# Patient Record
Sex: Male | Born: 1960 | Race: Black or African American | Hispanic: No | Marital: Married | Smoking: Never smoker
Health system: Southern US, Community
[De-identification: ages and names within clinical notes are randomized; demographics above are authoritative.]

## PROBLEM LIST (undated history)

## (undated) ENCOUNTER — Emergency Department (HOSPITAL_BASED_OUTPATIENT_CLINIC_OR_DEPARTMENT_OTHER): Source: Home / Self Care

## (undated) DIAGNOSIS — I10 Essential (primary) hypertension: Secondary | ICD-10-CM

## (undated) DIAGNOSIS — E7800 Pure hypercholesterolemia, unspecified: Secondary | ICD-10-CM

## (undated) DIAGNOSIS — R51 Headache: Secondary | ICD-10-CM

## (undated) DIAGNOSIS — I209 Angina pectoris, unspecified: Secondary | ICD-10-CM

## (undated) DIAGNOSIS — E785 Hyperlipidemia, unspecified: Secondary | ICD-10-CM

## (undated) HISTORY — PX: BACK SURGERY: SHX140

## (undated) NOTE — ED Notes (Signed)
 Formatting of this note might be different from the original. Pt is currently on his cell phone notifying his family that he is in the emergency room.  Currently stable  Electronically signed by Lafe Mt at 05/06/2021 12:41 PM EDT

## (undated) NOTE — ED Notes (Signed)
 Formatting of this note might be different from the original. Rounded on pt  Pt is unable to urinate at this time.  Electronically signed by Lafe Mt at 05/06/2021  1:14 PM EDT

## (undated) NOTE — ED Triage Notes (Signed)
 Formatting of this note might be different from the original. Pt arrives via ems with c/o syncopal episode  Pt was at work when this occurred  He became dizzy  Did not fall or hit head  Well-appearing  Was hypotensive with ems   Electronically signed by Lafe Mt at 05/06/2021 12:04 PM EDT

## (undated) NOTE — ED Notes (Signed)
 Formatting of this note might be different from the original. Rounded on pt  Pt resting in position of comfort  Family at bedside   Repeat labs collected.   Electronically signed by Lafe Mt at 05/06/2021  2:50 PM EDT

## (undated) NOTE — ED Provider Notes (Signed)
 Formatting of this note is different from the original. EMERGENCY DEPARTMENT HISTORY AND PHYSICAL EXAM  12:20 PM  Date: 05/06/2021 Patient Name: Jeremy Reed  History of Presenting Illness   Chief Complaint  Patient presents with  ? Dizziness   History Provided By: Patient Location/Duration/Severity/Modifying factors  HPI Jeremy Reed is a 85 y.o. male with asthma history of hypertension diabetes presenting for a near syncopal episode.  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  He was able to close up the trailer, walk around outside to do the same but then sat down because he felt like he was going to fall out.  The triage note says that he is complaining feeling dizzy but on clarification this is more lightheaded and presyncopal.  He is not having any chest pain, shortness of breath, abdominal pain, nausea, vomiting or diaphoresis during this.  He never did lose consciousness, fall or hit his head.  He was hypotensive on EMS arrival, received 200 cc bolus in route and now is normotensive.  He says he is feeling much better now that he is in the cool emergency room.  He denies tobacco, alcohol or illicit drug use.  No alleviating or aggravating factors.  Additionally complains of a dry nonproductive cough for the last few days, has been vaccinated against COVID and received a booster shot.  Would like to be tested for COVID.  PCP: Johnnye, Not On File, MD  Current Facility-Administered Medications  Medication Dose Route Frequency Provider Last Rate Last Admin  ? potassium chloride  (K-DUR, KLOR-CON  M20) SR tablet 40 mEq  40 mEq Oral NOW Darleen Redell BROCKS, MD       Current Outpatient Medications  Medication Sig Dispense Refill  ? omeprazole  (PRILOSEC) 20 mg capsule Take 20 mg by mouth daily.    ? metFORMIN  (GLUCOPHAGE ) 500 mg tablet Take  by mouth two (2) times daily (with meals).    ?  Olmesartan -amLODIPine -HCTZ 40-10-12.5 mg tab Take 1 Tab by mouth daily.    ? cholecalciferol , vitamin D3, 2,000 unit tab Take 2.5 Tabs by mouth daily.    ? atorvastatin (LIPITOR) 20 mg tablet Take 20 mg by mouth daily.    ? nebivolol  (BYSTOLIC ) 20 mg tablet Take 20 mg by mouth daily.    ? aspirin  delayed-release 81 mg tablet Take 81 mg by mouth daily.    ? multivitamin (ONE A DAY) tablet Take 1 Tab by mouth daily.    ? ondansetron  hcl (ZOFRAN , AS HYDROCHLORIDE,) 4 mg tablet Take 1 Tab by mouth every eight (8) hours as needed for Nausea. 10 Tab 0   Past History   Past Medical History: Past Medical History:  Diagnosis Date  ? Diabetes (HCC)   ? Gastrointestinal disorder    GERD  ? Hypertension    Past Surgical History: Past Surgical History:  Procedure Laterality Date  ? HX ORTHOPAEDIC     , back  ? HX OTHER SURGICAL     tonsillectomy   Family History: Family History  Problem Relation Age of Onset  ? Heart Disease Mother   ? Stroke Father    Social History: Social History   Tobacco Use  ? Smoking status: Never Smoker  ? Smokeless tobacco: Never Used  Substance Use Topics  ? Alcohol use: Yes    Comment: socially  ? Drug use: Not on file   Allergies:  No Known Allergies  I reviewed and confirmed the above information with patient and updated as necessary.  Review of Systems   Review of Systems  Constitutional: Negative for diaphoresis and fever.  HENT: Negative for ear pain and sore throat.   Eyes:       No acute change in vision  Respiratory: Negative for cough and shortness of breath.   Cardiovascular: Negative for chest pain and leg swelling.  Gastrointestinal: Negative for abdominal pain and vomiting.  Genitourinary: Negative for dysuria.  Musculoskeletal: Negative for neck pain.  Skin: Negative for wound.  Neurological: Positive for syncope (Near syncope). Negative for weakness and headaches.   Physical Exam   Visit Vitals BP (!) 149/78  Pulse 79   Temp 98.6 F (37 C)  Resp 20  Ht 5' 9 (1.753 m)  Wt 122.5 kg (270 lb)  SpO2 98%  BMI 39.87 kg/m   Physical Exam Vitals and nursing note reviewed.  Constitutional:      Appearance: Normal appearance. He is not ill-appearing.     Comments: Adult male resting comfortably, awake and alert, on the phone  HENT:     Mouth/Throat:     Mouth: Mucous membranes are moist.  Eyes:     Pupils: Pupils are equal, round, and reactive to light.  Cardiovascular:     Rate and Rhythm: Normal rate and regular rhythm.     Pulses: Normal pulses.     Heart sounds: Normal heart sounds.  Pulmonary:     Effort: Pulmonary effort is normal.     Breath sounds: Normal breath sounds.  Abdominal:     General: Abdomen is flat.     Tenderness: There is no abdominal tenderness.  Musculoskeletal:        General: No swelling. Normal range of motion.     Cervical back: Normal range of motion.  Skin:    General: Skin is warm.  Neurological:     General: No focal deficit present.     Mental Status: He is alert and oriented to person, place, and time.     Cranial Nerves: No cranial nerve deficit.     Sensory: No sensory deficit.     Motor: No weakness.   Diagnostic Study Results   Labs - Recent Results (from the past 24 hour(s))  CBC WITH AUTOMATED DIFF   Collection Time: 05/06/21 12:00 PM  Result Value Ref Range   WBC 7.5 4.6 - 13.2 K/uL   RBC 4.34 (L) 4.35 - 5.65 M/uL   HGB 12.6 (L) 13.0 - 16.0 g/dL   HCT 62.5 63.9 - 51.9 %   MCV 86.2 78.0 - 100.0 FL   MCH 29.0 24.0 - 34.0 PG   MCHC 33.7 31.0 - 37.0 g/dL   RDW 85.0 (H) 88.3 - 85.4 %   PLATELET 143 135 - 420 K/uL   MPV 11.6 9.2 - 11.8 FL   NRBC 0.0 0 PER 100 WBC   ABSOLUTE NRBC 0.00 0.00 - 0.01 K/uL   NEUTROPHILS 59 40 - 73 %   LYMPHOCYTES 26 21 - 52 %   MONOCYTES 13 (H) 3 - 10 %   EOSINOPHILS 1 0 - 5 %   BASOPHILS 1 0 - 2 %   IMMATURE GRANULOCYTES 0 0.0 - 0.5 %   ABS. NEUTROPHILS 4.4 1.8 - 8.0 K/UL   ABS. LYMPHOCYTES 1.9 0.9 - 3.6 K/UL    ABS. MONOCYTES 1.0 0.05 - 1.2 K/UL   ABS. EOSINOPHILS 0.1 0.0 - 0.4 K/UL  ABS. BASOPHILS 0.0 0.0 - 0.1 K/UL   ABS. IMM. GRANS. 0.0 0.00 - 0.04 K/UL   DF AUTOMATED    METABOLIC PANEL, COMPREHENSIVE   Collection Time: 05/06/21 12:00 PM  Result Value Ref Range   Sodium 137 136 - 145 mmol/L   Potassium 3.0 (L) 3.5 - 5.5 mmol/L   Chloride 102 100 - 111 mmol/L   CO2 27 21 - 32 mmol/L   Anion gap 8 3.0 - 18 mmol/L   Glucose 155 (H) 74 - 99 mg/dL   BUN 10 7.0 - 18 MG/DL   Creatinine 8.76 0.6 - 1.3 MG/DL   BUN/Creatinine ratio 8 (L) 12 - 20     GFR est AA >60 >60 ml/min/1.81m2   GFR est non-AA 60 (L) >60 ml/min/1.78m2   Calcium  8.6 8.5 - 10.1 MG/DL   Bilirubin, total 0.5 0.2 - 1.0 MG/DL   ALT (SGPT) 59 16 - 61 U/L   AST (SGOT) 38 10 - 38 U/L   Alk. phosphatase 69 45 - 117 U/L   Protein, total 7.5 6.4 - 8.2 g/dL   Albumin 3.4 3.4 - 5.0 g/dL   Globulin 4.1 (H) 2.0 - 4.0 g/dL   A-G Ratio 0.8 0.8 - 1.7    TROPONIN-HIGH SENSITIVITY   Collection Time: 05/06/21 12:00 PM  Result Value Ref Range   Troponin-High Sensitivity 75 0 - 78 ng/L  NT-PRO BNP   Collection Time: 05/06/21 12:00 PM  Result Value Ref Range   NT pro-BNP 161 0 - 900 PG/ML  EKG, 12 LEAD, INITIAL   Collection Time: 05/06/21 12:07 PM  Result Value Ref Range   Ventricular Rate 76 BPM   Atrial Rate 76 BPM   P-R Interval 172 ms   QRS Duration 146 ms   Q-T Interval 394 ms   QTC Calculation (Bezet) 443 ms   Calculated P Axis 45 degrees   Calculated R Axis -27 degrees   Calculated T Axis -45 degrees   Diagnosis      Normal sinus rhythm Right bundle branch block Minimal voltage criteria for LVH, may be normal variant T wave abnormality, consider inferolateral ischemia Abnormal ECG No previous ECGs available Confirmed by Porfirio, MD, Ryan [id number]) on 05/06/2021 12:41:35 PM  SARS-COV-2   Collection Time: 05/06/21 12:30 PM  Result Value Ref Range   SARS-CoV-2 by PCR Please find results under separate order    EKG, 12  LEAD, SUBSEQUENT   Collection Time: 05/06/21  2:29 PM  Result Value Ref Range   Ventricular Rate 82 BPM   Atrial Rate 82 BPM   P-R Interval 182 ms   QRS Duration 146 ms   Q-T Interval 386 ms   QTC Calculation (Bezet) 450 ms   Calculated P Axis 41 degrees   Calculated R Axis -25 degrees   Calculated T Axis -40 degrees   Diagnosis      Normal sinus rhythm Right bundle branch block T wave abnormality, consider lateral ischemia Abnormal ECG When compared with ECG of 06-May-2021 12:07, No significant change was found Confirmed by Porfirio, MD, Ryan ([id number]) on 05/06/2021 4:07:54 PM  URINALYSIS W/ RFLX MICROSCOPIC   Collection Time: 05/06/21  2:30 PM  Result Value Ref Range   Color YELLOW     Appearance CLEAR     Specific gravity 1.014 1.005 - 1.030     pH (UA) 6.0 5.0 - 8.0     Protein Negative NEG mg/dL   Glucose Negative NEG mg/dL   Ketone Negative NEG mg/dL  Bilirubin Negative NEG     Blood Negative NEG     Urobilinogen 1.0 0.2 - 1.0 EU/dL   Nitrites Negative NEG     Leukocyte Esterase Negative NEG    TROPONIN-HIGH SENSITIVITY   Collection Time: 05/06/21  2:30 PM  Result Value Ref Range   Troponin-High Sensitivity 75 0 - 78 ng/L   Radiologic Studies -  XR CHEST PORT  Final Result  No acute cardiopulmonary findings.    Medical Decision Making  I am the first provider for this patient.  I reviewed the vital signs, available nursing notes, past medical history, past surgical history, family history and social history.  Vital Signs-Reviewed the patient's vital signs.  EKG: Right bundle branch block, no ST elevation, no priors for comparison  Records Reviewed: Nursing Notes and Old Medical Records (Time of Review: 12:20 PM)  Provider Notes (Medical Decision Making):  MDM 28 year old male here for any syncopal episode consider dehydration, AKI, arrhythmia, no evidence of any seizure or trauma  ED Course: Progress Notes, Reevaluation, and Consults: Patient afebrile  and hemodynamically normal on arrival, awake and alert, oriented, exam is reassuring  We will screen labs, troponin, chest x-ray.  At his request will test for COVID.  CBC unremarkable CMP with hypokalemia, will replete orally Initial troponin 75, EKG shows no ST elevation but there is no prior for comparison, he does have a right bundle branch block with ST depression diffusely, but not focally Repeat EKG and troponin are level at 75 I discussed with Renee from cardiology who will talk to her attending, and independently reviewed the EKG, and recommend outpatient follow-up, I think this is reasonable as the patient is asymptomatic and has had no chest pain  Patient was discharged home in stable condition, will follow up with cardiology outpatient.  Signs and symptoms prompting return to the ED were discussed.  He was discharged home in stable condition.    Procedures  Diagnosis   Clinical Impression:  1. Near syncope   2. Right bundle branch block (RBBB)   3. EKG abnormality   4. Hypokalemia    Disposition:Home  Follow-up Information    Follow up With Specialties Details Why Contact Info   [facility] EMERGENCY DEPT Emergency Medicine  As needed, If symptoms worsen [address] [place]  [address] [phone]   Seutter, Bernardino SAILOR, MD Cardiovascular Disease Physician, Internal Medicine Physician Schedule an appointment as soon as possible for a visit   [address] [place] [address] [phone]     Patient's Medications  Start Taking   No medications on file  Continue Taking   ASPIRIN  DELAYED-RELEASE 81 MG TABLET    Take 81 mg by mouth daily.   ATORVASTATIN (LIPITOR) 20 MG TABLET    Take 20 mg by mouth daily.   CHOLECALCIFEROL , VITAMIN D3, 2,000 UNIT TAB    Take 2.5 Tabs by mouth daily.   METFORMIN  (GLUCOPHAGE ) 500 MG TABLET    Take  by mouth two (2) times daily (with meals).   MULTIVITAMIN (ONE A DAY) TABLET    Take 1 Tab by mouth daily.   NEBIVOLOL   (BYSTOLIC ) 20 MG TABLET    Take 20 mg by mouth daily.   OLMESARTAN -AMLODIPINE -HCTZ 40-10-12.5 MG TAB    Take 1 Tab by mouth daily.   OMEPRAZOLE  (PRILOSEC) 20 MG CAPSULE    Take 20 mg by mouth daily.   ONDANSETRON  HCL (ZOFRAN , AS HYDROCHLORIDE,) 4 MG TABLET    Take 1 Tab by mouth  every eight (8) hours as needed for Nausea.  These Medications have changed   No medications on file  Stop Taking   No medications on file   Redell Breeding, MD  Emergency Medicine  May 06, 2021, 12:20 PM   This note is dictated utilizing Conservation officer, historic buildings. Unfortunately this leads to occasional typographical errors using the voice recognition. I apologize in advance if the situation occurs. If questions occur please do not hesitate to contact me directly.  Redell JAYSON Breeding, MD  Electronically signed by Breeding Redell JAYSON, MD at 05/06/2021  4:30 PM EDT

---

## 1978-11-28 HISTORY — PX: ANKLE FRACTURE SURGERY: SHX122

## 2001-11-28 HISTORY — PX: OTHER SURGICAL HISTORY: SHX169

## 2006-07-27 ENCOUNTER — Ambulatory Visit (HOSPITAL_BASED_OUTPATIENT_CLINIC_OR_DEPARTMENT_OTHER): Admission: RE | Admit: 2006-07-27 | Discharge: 2006-07-27 | Payer: Self-pay | Admitting: Orthopedic Surgery

## 2006-08-08 ENCOUNTER — Ambulatory Visit (HOSPITAL_BASED_OUTPATIENT_CLINIC_OR_DEPARTMENT_OTHER): Admission: RE | Admit: 2006-08-08 | Discharge: 2006-08-08 | Payer: Self-pay | Admitting: Orthopedic Surgery

## 2007-01-01 ENCOUNTER — Emergency Department (HOSPITAL_COMMUNITY): Admission: EM | Admit: 2007-01-01 | Discharge: 2007-01-02 | Payer: Self-pay | Admitting: Emergency Medicine

## 2011-11-05 ENCOUNTER — Emergency Department (HOSPITAL_COMMUNITY)

## 2011-11-05 ENCOUNTER — Observation Stay (HOSPITAL_COMMUNITY)
Admission: EM | Admit: 2011-11-05 | Discharge: 2011-11-06 | Disposition: A | Discharge status: Home / Self Care | Attending: Internal Medicine | Admitting: Internal Medicine

## 2011-11-05 ENCOUNTER — Other Ambulatory Visit: Payer: Self-pay

## 2011-11-05 ENCOUNTER — Encounter: Payer: Self-pay | Admitting: Emergency Medicine

## 2011-11-05 DIAGNOSIS — E785 Hyperlipidemia, unspecified: Secondary | ICD-10-CM | POA: Diagnosis present

## 2011-11-05 DIAGNOSIS — R079 Chest pain, unspecified: Principal | ICD-10-CM

## 2011-11-05 DIAGNOSIS — E119 Type 2 diabetes mellitus without complications: Secondary | ICD-10-CM | POA: Diagnosis present

## 2011-11-05 DIAGNOSIS — E876 Hypokalemia: Secondary | ICD-10-CM | POA: Insufficient documentation

## 2011-11-05 DIAGNOSIS — I10 Essential (primary) hypertension: Secondary | ICD-10-CM | POA: Diagnosis present

## 2011-11-05 HISTORY — DX: Headache: R51

## 2011-11-05 HISTORY — DX: Essential (primary) hypertension: I10

## 2011-11-05 HISTORY — DX: Hyperlipidemia, unspecified: E78.5

## 2011-11-05 HISTORY — DX: Pure hypercholesterolemia, unspecified: E78.00

## 2011-11-05 HISTORY — DX: Angina pectoris, unspecified: I20.9

## 2011-11-05 LAB — DIFFERENTIAL
Eosinophils Absolute: 0.1 10*3/uL (ref 0.0–0.7)
Lymphocytes Relative: 29 % (ref 12–46)
Lymphs Abs: 2.5 10*3/uL (ref 0.7–4.0)
Monocytes Relative: 11 % (ref 3–12)
Neutrophils Relative %: 58 % (ref 43–77)

## 2011-11-05 LAB — POCT I-STAT, CHEM 8
BUN: 16 mg/dL (ref 6–23)
Creatinine, Ser: 1 mg/dL (ref 0.50–1.35)
Glucose, Bld: 111 mg/dL — ABNORMAL HIGH (ref 70–99)
Hemoglobin: 14.3 g/dL (ref 13.0–17.0)
Potassium: 3.2 mEq/L — ABNORMAL LOW (ref 3.5–5.1)

## 2011-11-05 LAB — GLUCOSE, CAPILLARY
Glucose-Capillary: 94 mg/dL (ref 70–99)
Glucose-Capillary: 112 mg/dL — ABNORMAL HIGH (ref 70–99)

## 2011-11-05 LAB — CBC
Hemoglobin: 14.2 g/dL (ref 13.0–17.0)
MCH: 30.9 pg (ref 26.0–34.0)
MCV: 88 fL (ref 78.0–100.0)
RBC: 4.59 MIL/uL (ref 4.22–5.81)
WBC: 8.7 10*3/uL (ref 4.0–10.5)

## 2011-11-05 LAB — CARDIAC PANEL(CRET KIN+CKTOT+MB+TROPI)
CK, MB: 2.3 ng/mL (ref 0.3–4.0)
Troponin I: <0.3 ng/mL (ref <0.30)

## 2011-11-05 LAB — D-DIMER, QUANTITATIVE
D-Dimer, Quant: <0.22 ug/mL-FEU (ref 0.00–0.48)
D-Dimer, Quant: 0.25 ug/mL-FEU (ref 0.00–0.48)

## 2011-11-05 LAB — MRSA PCR SCREENING: MRSA by PCR: NEGATIVE

## 2011-11-05 LAB — POCT I-STAT TROPONIN I

## 2011-11-05 MED ORDER — ALUM & MAG HYDROXIDE-SIMETH 200-200-20 MG/5ML PO SUSP
30.0000 mL | Freq: Four times a day (QID) | ORAL | Status: DC | PRN
  Administered 2011-11-06: 30 mL via ORAL
  Filled 2011-11-05: qty 30

## 2011-11-05 MED ORDER — MORPHINE SULFATE 2 MG/ML IJ SOLN
2.0000 mg | INTRAMUSCULAR | Status: DC | PRN
  Administered 2011-11-06: 2 mg via INTRAVENOUS
  Filled 2011-11-05: qty 1

## 2011-11-05 MED ORDER — METFORMIN HCL 500 MG PO TABS
1000.0000 mg | ORAL_TABLET | Freq: Two times a day (BID) | ORAL | Status: DC
  Administered 2011-11-05 – 2011-11-06 (×2): 1000 mg via ORAL
  Filled 2011-11-05 (×4): qty 2

## 2011-11-05 MED ORDER — POTASSIUM CHLORIDE CRYS ER 20 MEQ PO TBCR
20.0000 meq | EXTENDED_RELEASE_TABLET | Freq: Every day | ORAL | Status: DC
  Administered 2011-11-05 – 2011-11-06 (×2): 20 meq via ORAL
  Filled 2011-11-05: qty 1

## 2011-11-05 MED ORDER — NITROGLYCERIN 0.4 MG SL SUBL
0.4000 mg | SUBLINGUAL_TABLET | SUBLINGUAL | Status: DC | PRN
  Administered 2011-11-06 (×2): 0.4 mg via SUBLINGUAL

## 2011-11-05 MED ORDER — EZETIMIBE 10 MG PO TABS
10.0000 mg | ORAL_TABLET | Freq: Every day | ORAL | Status: DC
  Administered 2011-11-05 – 2011-11-06 (×2): 10 mg via ORAL
  Filled 2011-11-05 (×2): qty 1

## 2011-11-05 MED ORDER — ENOXAPARIN SODIUM 40 MG/0.4ML ~~LOC~~ SOLN
40.0000 mg | SUBCUTANEOUS | Status: DC
  Administered 2011-11-05: 40 mg via SUBCUTANEOUS
  Filled 2011-11-05 (×2): qty 0.4

## 2011-11-05 MED ORDER — ROSUVASTATIN CALCIUM 10 MG PO TABS
10.0000 mg | ORAL_TABLET | Freq: Every day | ORAL | Status: DC
  Administered 2011-11-05 – 2011-11-06 (×2): 10 mg via ORAL
  Filled 2011-11-05 (×2): qty 1

## 2011-11-05 MED ORDER — VITAMIN D 1000 UNITS PO TABS
1000.0000 [IU] | ORAL_TABLET | Freq: Every day | ORAL | Status: DC
  Administered 2011-11-05 – 2011-11-06 (×2): 1000 [IU] via ORAL
  Filled 2011-11-05 (×2): qty 1

## 2011-11-05 MED ORDER — ASPIRIN 81 MG PO CHEW: 324.0000 mg | CHEWABLE_TABLET | Freq: Once | ORAL | Status: DC

## 2011-11-05 MED ORDER — THERA M PLUS PO TABS
1.0000 | ORAL_TABLET | Freq: Every day | ORAL | Status: DC
  Administered 2011-11-05 – 2011-11-06 (×2): 1 via ORAL
  Filled 2011-11-05 (×2): qty 1

## 2011-11-05 MED ORDER — OLMESARTAN-AMLODIPINE-HCTZ 40-10-25 MG PO TABS: 1.0000 | ORAL_TABLET | Freq: Every day | ORAL | Status: DC

## 2011-11-05 MED ORDER — OLMESARTAN MEDOXOMIL 40 MG PO TABS
40.0000 mg | ORAL_TABLET | Freq: Every day | ORAL | Status: DC
  Administered 2011-11-05 – 2011-11-06 (×2): 40 mg via ORAL
  Filled 2011-11-05 (×2): qty 1

## 2011-11-05 MED ORDER — SODIUM CHLORIDE 0.9 % IJ SOLN: 3.0000 mL | INTRAMUSCULAR | Status: DC | PRN

## 2011-11-05 MED ORDER — SODIUM CHLORIDE 0.9 % IV SOLN: 250.0000 mL | INTRAVENOUS | Status: DC | PRN

## 2011-11-05 MED ORDER — NEBIVOLOL HCL 10 MG PO TABS
10.0000 mg | ORAL_TABLET | Freq: Every day | ORAL | Status: DC
  Administered 2011-11-05 – 2011-11-06 (×2): 10 mg via ORAL
  Filled 2011-11-05 (×2): qty 1

## 2011-11-05 MED ORDER — SODIUM CHLORIDE 0.9 % IJ SOLN
3.0000 mL | Freq: Two times a day (BID) | INTRAMUSCULAR | Status: DC
  Administered 2011-11-05 – 2011-11-06 (×3): 3 mL via INTRAVENOUS

## 2011-11-05 MED ORDER — AMLODIPINE BESYLATE 10 MG PO TABS
10.0000 mg | ORAL_TABLET | Freq: Every day | ORAL | Status: DC
  Administered 2011-11-05 – 2011-11-06 (×2): 10 mg via ORAL
  Filled 2011-11-05 (×2): qty 1

## 2011-11-05 MED ORDER — HYDROCHLOROTHIAZIDE 25 MG PO TABS
25.0000 mg | ORAL_TABLET | Freq: Every day | ORAL | Status: DC
  Administered 2011-11-06: 25 mg via ORAL
  Filled 2011-11-05 (×2): qty 1

## 2011-11-05 MED ORDER — SODIUM CHLORIDE 0.9 % IV SOLN
Freq: Once | INTRAVENOUS | Status: AC
  Administered 2011-11-05: 05:00:00 via INTRAVENOUS

## 2011-11-05 MED ORDER — ASPIRIN 81 MG PO CHEW
81.0000 mg | CHEWABLE_TABLET | Freq: Every day | ORAL | Status: DC
  Administered 2011-11-06: 81 mg via ORAL
  Filled 2011-11-05: qty 1

## 2011-11-05 NOTE — ED Notes (Signed)
Lab at bedside

## 2011-11-05 NOTE — ED Provider Notes (Signed)
History     CSN: [id number] Arrival date & time: 11/05/2011  3:51 AM   First MD Initiated Contact with Patient 11/05/11 [phone]      Chief Complaint  Patient presents with  . Chest Pain    (Consider location/radiation/quality/duration/timing/severity/associated sxs/prior treatment) HPI This is a 50 year old black male with a history of hypertension and hyperlipidemia as well as diabetes mellitus. He's had several episodes of chest pain since yesterday morning. The first episode occurred after eating. He states he has pain with swallowing, describing it as if there is a warm or speed bump that the food must pass. This morning he awoke with 7/10 chest pain. There was no associated dyspnea, nausea or diaphoresis. EMS was called and they administered one sublingual nitroglycerin with significant relief of pain. He states he only has a little bit of pain now when he takes a deep breath. He has a history of coronary artery disease.  Past Medical History  Diagnosis Date  . Hypertension   . Hyperlipidemia   . Diabetes mellitus   . Hypercholesteremia     History reviewed. No pertinent past surgical history.  History reviewed. No pertinent family history.  History  Substance Use Topics  . Smoking status: Never Smoker   . Smokeless tobacco: Not on file  . Alcohol Use: No      Review of Systems  All other systems reviewed and are negative.    Allergies  Review of patient's allergies indicates no known allergies.  Home Medications   Current Outpatient Rx  Name Route Sig Dispense Refill  . ASPIRIN 81 MG PO TABS Oral Take 81 mg by mouth daily.      Marland Kitchen VITAMIN D HIGH POTENCY PO Oral Take 1 tablet by mouth daily.      Marland Kitchen EZETIMIBE 10 MG PO TABS Oral Take 10 mg by mouth daily.      Marland Kitchen METFORMIN HCL 500 MG PO TABS Oral Take 1,000 mg by mouth 2 (two) times daily with a meal.     . THERA M PLUS PO TABS Oral Take 1 tablet by mouth daily.      . NEBIVOLOL HCL 10 MG PO TABS Oral Take 10 mg  by mouth daily.      Marland Kitchen OLMESARTAN-AMLODIPINE-HCTZ 40-10-25 MG PO TABS Oral Take 1 tablet by mouth daily.      Marland Kitchen POTASSIUM CHLORIDE 10 MEQ PO TBCR Oral Take 10 mEq by mouth daily.      Marland Kitchen ROSUVASTATIN CALCIUM 10 MG PO TABS Oral Take 10 mg by mouth daily.      Marland Kitchen SITAGLIPTIN PHOSPHATE 100 MG PO TABS Oral Take 100 mg by mouth daily.        BP 129/66  Pulse 84  Temp(Src) 98.9 F (37.2 C) (Oral)  Resp 17  Ht 5\' 9"  (1.753 m)  Wt 250 lb (113.399 kg)  BMI 36.92 kg/m2  SpO2 97%  Physical Exam General: Well-developed, well-nourished male in no acute distress; appearance consistent with age of record HENT: normocephalic, atraumatic Eyes: normal appearance Neck: supple Heart: regular rate and rhythm; no murmurs Lungs: clear to auscultation bilaterally Abdomen: soft; nontender; nondistended; bowel sounds present Extremities: No deformity; full range of motion; pulses normal Neurologic: Awake, alert and oriented; motor function intact in all extremities and symmetric; no facial droop Skin: Warm and dry Psychiatric: Normal mood and affect    ED Course  Procedures (including critical care time)    MDM   Nursing notes and vitals signs, including pulse oximetry,  reviewed.  Summary of this visit's results, reviewed by myself:  Labs:  Results for orders placed during the hospital encounter of 11/05/11  CBC      Component Value Range   WBC 8.7  4.0 - 10.5 (K/uL)   RBC 4.59  4.22 - 5.81 (MIL/uL)   Hemoglobin 14.2  13.0 - 17.0 (g/dL)   HCT 40.9  81.1 - 91.4 (%)   MCV 88.0  78.0 - 100.0 (fL)   MCH 30.9  26.0 - 34.0 (pg)   MCHC 35.1  30.0 - 36.0 (g/dL)   RDW 78.2  95.6 - 21.3 (%)   Platelets 173  150 - 400 (K/uL)  DIFFERENTIAL      Component Value Range   Neutrophils Relative 58  43 - 77 (%)   Neutro Abs 5.1  1.7 - 7.7 (K/uL)   Lymphocytes Relative 29  12 - 46 (%)   Lymphs Abs 2.5  0.7 - 4.0 (K/uL)   Monocytes Relative 11  3 - 12 (%)   Monocytes Absolute 1.0  0.1 - 1.0 (K/uL)    Eosinophils Relative 1  0 - 5 (%)   Eosinophils Absolute 0.1  0.0 - 0.7 (K/uL)   Basophils Relative 0  0 - 1 (%)   Basophils Absolute 0.0  0.0 - 0.1 (K/uL)  POCT I-STAT, CHEM 8      Component Value Range   Sodium 142  135 - 145 (mEq/L)   Potassium 3.2 (*) 3.5 - 5.1 (mEq/L)   Chloride 103  96 - 112 (mEq/L)   BUN 16  6 - 23 (mg/dL)   Creatinine, Ser 0.86  0.50 - 1.35 (mg/dL)   Glucose, Bld 578 (*) 70 - 99 (mg/dL)   Calcium, Ion 4.69  1.12 - 1.32 (mmol/L)   TCO2 27  0 - 100 (mmol/L)   Hemoglobin 14.3  13.0 - 17.0 (g/dL)   HCT 62.9  52.8 - 41.3 (%)  POCT I-STAT TROPONIN I      Component Value Range   Troponin i, poc 0.00  0.00 - 0.08 (ng/mL)   Comment 3            Dg Chest 2 View  11/05/2011  *RADIOLOGY REPORT*  Clinical Data: Chest pain.  CHEST - 2 VIEW  Comparison: None.  Findings: Cardiopericardial silhouette upper limits of normal for projection.  No airspace disease.  No effusion.  Mediastinal contours are normal.  Trachea midline.  IMPRESSION: No active cardiopulmonary disease.  Original Report Authenticated By: Andreas Newport, M.D.    EKG Interpretation:  Date & Time: 11/05/2011 3:52 AM  Rate: 81  Rhythm: normal sinus rhythm  QRS Axis: normal  Intervals: normal  ST/T Wave abnormalities: normal  Conduction Disutrbances:right bundle branch block and left anterior fascicular block  Narrative Interpretation:   Old EKG Reviewed: unchanged  7:43 AM Tried hospitalist to admit.             Hanley Seamen, MD 11/05/11 [phone]

## 2011-11-05 NOTE — ED Notes (Signed)
Patient reports taking 324 ASA before EMS arrival; patient had one nitro SL by EMS (brought pain from 8/10 to 2/10).

## 2011-11-05 NOTE — ED Notes (Signed)
Patient states that he woke up around 0300 last night and started to have a "pressure" like chest pain in the center of his chest; states that the pain worsens upon inspiration.  Patient reports belching; patient drank ginger ale and states that the chest pain went away around 0700.  Patient states that he began to have a "lump-like feeling" in his esophagus during his evening meal last night (around 1900).  Patient went to bed; woke up around 0300 this morning with the same chest pain.  Patient denies diaphoresis, shortness of breath, numbness/tingling in hands/feet, and nausea/vomiting.  Patient describes location of chest pain as "mid-sternum"; denies radiation of pain.  Patient rates chest pain 2/10 on the numerical pain scale.  Patient reports belching and states that the chest pain worsens upon deep inspiration.  Patient alert and oriented x4; PERRL present.  Upon arrival to room, patient change into gown and connected to continuous cardiac, pulse ox, and blood pressure monitor.  Wife present at bedside.  Will continue to monitor.

## 2011-11-05 NOTE — ED Notes (Signed)
Patient denies pain and is resting comfortably.  

## 2011-11-05 NOTE — ED Notes (Signed)
Patient back from x-ray; no respiratory or acute distress noted.  Reconnected patient to cardiac monitor.  Updated patient on plan of care; informed patient that we are currently waiting on x-ray results.  Patient has no questions or concerns at this time.  Wife present at bedside; will continue to monitor.

## 2011-11-05 NOTE — ED Notes (Signed)
Patient currently sitting up in bed; no respiratory or acute distress noted.  Wife present at bedside.  Will continue to monitor.

## 2011-11-05 NOTE — ED Notes (Signed)
Patient currently resting quietly in bed; no respiratory or acute distress noted.  Updated patient on plan of care; informed patient that x-ray will be by to transport patient for scans.  Patient has no other questions or concerns at this time.  Will continue to monitor.

## 2011-11-05 NOTE — ED Notes (Signed)
Pt here by ems for chest pain, gradual onset, substernal in nature, started yesterday. 8/10 initially with ems, 12 lead shows right bundle branch, unsuccessful iv per ems. Given 1 sl ntg with ems and pain now 2/10.

## 2011-11-05 NOTE — H&P (Addendum)
PCP:  Gillian Scarce, MD   DOA:  11/05/2011  3:51 AM  Chief Complaint:  Substernal chest pain x 1 day  HPI: Jeremy Reed is a 50 year old obese African American male with pasta medical history of hypertension, type 2 diabetes mellitus on metformin, hyperlipidemia who presented to the ED with symptoms of substernal chest pain since yesterday. According to the patient he was in his usual state of health when he woke up yesterday morning at 3 AM to go to work when he started having substernal chest pain which was 4/10 in intensity, pressure-like, nonradiating and worsened to 7 / 10 on deep inspiration. The symptoms lasted for almost 3 hours when he took some ginger ale as he was also having some belching with it. Symptoms subsided during the day however in the evening while he was having his supper he started having those symptoms again and the food felt like a lump with difficulty swallowing it. He stopped eating and went to bed and when his wife woke him up at 3 a.m. Today he started having similar symptoms again. He then called EMS who gave him 325 mg of aspirin and a dose of sublingual nitrates following which his symptoms resolved. He denies having any chest pain at this time. He denied any associated palpitations or shortness of breath. He denies any nausea or vomiting. Denies any abdominal pain fever or headaches. He informs having some funny symptoms in the chest over a year ago and had a stress test done that was ordered by his PCP and that was reported to be normal. He denies having any history of similar chest pain or history of heart attack in the past. He denies any symptoms of acid reflux. Denies any bowel or urinary symptoms. He denies any calf pain or leg swellings.   Allergies: No Known Allergies  Prior to Admission medications   Medication Sig Start Date End Date Taking? Authorizing Provider  aspirin 81 MG tablet Take 81 mg by mouth daily.     Yes Historical Provider, MD    Cholecalciferol (VITAMIN D HIGH POTENCY PO) Take 1 tablet by mouth daily.     Yes Historical Provider, MD  ezetimibe (ZETIA) 10 MG tablet Take 10 mg by mouth daily.     Yes Historical Provider, MD  metFORMIN (GLUCOPHAGE) 500 MG tablet Take 1,000 mg by mouth 2 (two) times daily with a meal.    Yes Historical Provider, MD  Multiple Vitamins-Minerals (MULTIVITAMINS THER. W/MINERALS) TABS Take 1 tablet by mouth daily.     Yes Historical Provider, MD  nebivolol (BYSTOLIC) 10 MG tablet Take 10 mg by mouth daily.     Yes Historical Provider, MD  Olmesartan-Amlodipine-HCTZ (TRIBENZOR) 40-10-25 MG TABS Take 1 tablet by mouth daily.     Yes Historical Provider, MD  potassium chloride (KLOR-CON) 10 MEQ CR tablet Take 10 mEq by mouth daily.     Yes Historical Provider, MD  rosuvastatin (CRESTOR) 10 MG tablet Take 10 mg by mouth daily.     Yes Historical Provider, MD  sitaGLIPtin (JANUVIA) 100 MG tablet Take 100 mg by mouth daily.     Yes Historical Provider, MD    Past Medical History  Diagnosis Date  . Hypertension   . Hyperlipidemia   . Diabetes mellitus   . Hypercholesteremia   . Chr hypokalemia   . Headache     Past Surgical History  Procedure Date  . Back surgery   . Ankle fracture surgery 1980  . Carpel  2003    carpel tunnel surgery-bilateral hands    Social History:  reports that he has never smoked. He reports that he does not drink alcohol or use illicit drugs. 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  HEENT: Denies photophobia, eye pain, redness, hearing loss, ear pain, congestion, sore throat, rhinorrhea, sneezing, mouth sores, trouble swallowing, neck pain, neck stiffness and tinnitus.   Respiratory: Denies SOB, DOE, cough, chest tightness,  and wheezing.   Cardiovascular: Denies chest pain, palpitations and leg  swelling.  Gastrointestinal: positive for belching, Denies nausea, vomiting, abdominal pain, diarrhea, constipation, blood in stool and abdominal distention.  Genitourinary: Denies dysuria, urgency, frequency, hematuria, flank pain and difficulty urinating.  Musculoskeletal: Denies myalgias, back pain, joint swelling, arthralgias and gait problem.  Skin: Denies pallor, rash and wound.  Neurological: Denies dizziness, seizures, syncope, weakness, light-headedness, numbness and headaches.  Hematological: Denies adenopathy. Easy bruising, personal or family bleeding history  Psychiatric/Behavioral: Denies suicidal ideation, mood changes, confusion, nervousness, sleep disturbance and agitation   Physical Exam:  Filed Vitals:   11/05/11 0443 11/05/11 0545 11/05/11 0702 11/05/11 0933  BP: 121/70 150/81 129/66 120/70  Pulse: 78 84  71  Temp:   98.9 F (37.2 C)   TempSrc:   Oral   Resp: 18 18 17 16   Height:      Weight:      SpO2: 98% 100% 97% 98%    Constitutional: Vital signs reviewed.  Patient is a well-developed and well-nourished in no acute distress and cooperative with exam. Alert and oriented x3.  Head: Normocephalic and atraumatic Ear: TM normal bilaterally Mouth: no erythema or exudates, MMM Eyes: PERRL, EOMI, conjunctivae normal, No scleral icterus.  Neck: Supple, Trachea midline normal ROM, No JVD, mass, thyromegaly, or carotid bruit present.  Cardiovascular: RRR, S1 normal, S2 normal, no MRG, pulses symmetric and intact bilaterally Pulmonary/Chest: CTAB, no wheezes, rales, or rhonchi Abdominal: Soft. Non-tender, non-distended, bowel sounds are normal, no masses, organomegaly, or guarding present.  GU: no CVA tenderness Musculoskeletal: No joint deformities, erythema, or stiffness, ROM full and no nontender Ext: no edema and no cyanosis, pulses palpable bilaterally (DP and PT) Hematology: no cervical, inginal, or axillary adenopathy.  Neurological: A&O x3, Strenght is normal  and symmetric bilaterally, cranial nerve II-XII are grossly intact, no focal motor deficit, sensory intact to light touch bilaterally.  Skin: Warm, dry and intact. No rash, cyanosis, or clubbing.  Psychiatric: Normal mood and affect. speech and behavior is normal. Judgment and thought content normal. Cognition and memory are normal.   Labs on Admission:  Results for orders placed during the hospital encounter of 11/05/11 (from the past 48 hour(s))  CBC     Status: Normal   Collection Time   11/05/11  4:45 AM      Component Value Range Comment   WBC 8.7  4.0 - 10.5 (K/uL)    RBC 4.59  4.22 - 5.81 (MIL/uL)    Hemoglobin 14.2  13.0 - 17.0 (g/dL)    HCT 04.5  40.9 - 81.1 (%)    MCV 88.0  78.0 - 100.0 (fL)    MCH 30.9  26.0 - 34.0 (pg)    MCHC 35.1  30.0 - 36.0 (g/dL)    RDW 91.4  78.2 - 95.6 (%)    Platelets 173  150 - 400 (K/uL)   DIFFERENTIAL  Status: Normal   Collection Time   11/05/11  4:45 AM      Component Value Range Comment   Neutrophils Relative 58  43 - 77 (%)    Neutro Abs 5.1  1.7 - 7.7 (K/uL)    Lymphocytes Relative 29  12 - 46 (%)    Lymphs Abs 2.5  0.7 - 4.0 (K/uL)    Monocytes Relative 11  3 - 12 (%)    Monocytes Absolute 1.0  0.1 - 1.0 (K/uL)    Eosinophils Relative 1  0 - 5 (%)    Eosinophils Absolute 0.1  0.0 - 0.7 (K/uL)    Basophils Relative 0  0 - 1 (%)    Basophils Absolute 0.0  0.0 - 0.1 (K/uL)   POCT I-STAT TROPONIN I     Status: Normal   Collection Time   11/05/11  4:58 AM      Component Value Range Comment   Troponin i, poc 0.00  0.00 - 0.08 (ng/mL)    Comment 3            POCT I-STAT, CHEM 8     Status: Abnormal   Collection Time   11/05/11  4:59 AM      Component Value Range Comment   Sodium 142  135 - 145 (mEq/L)    Potassium 3.2 (*) 3.5 - 5.1 (mEq/L)    Chloride 103  96 - 112 (mEq/L)    BUN 16  6 - 23 (mg/dL)    Creatinine, Ser 1.61  0.50 - 1.35 (mg/dL)    Glucose, Bld 096 (*) 70 - 99 (mg/dL)    Calcium, Ion 0.45  1.12 - 1.32 (mmol/L)     TCO2 27  0 - 100 (mmol/L)    Hemoglobin 14.3  13.0 - 17.0 (g/dL)    HCT 40.9  81.1 - 91.4 (%)   POCT I-STAT TROPONIN I     Status: Normal   Collection Time   11/05/11  7:12 AM      Component Value Range Comment   Troponin i, poc 0.00  0.00 - 0.08 (ng/mL)    Comment 3            D-DIMER, QUANTITATIVE     Status: Normal   Collection Time   11/05/11  9:11 AM      Component Value Range Comment   D-Dimer, Quant <0.22  0.00 - 0.48 (ug/mL-FEU)    EKG: Normal sinus rhythm at 81, 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    Radiological Exams on Admission: Chest x-ray: Normal  Assessment Mr.  Wardlow is a 50 year old obese African American male with history of hypertension, type 2 diabetes, hyperlipidemia who presented to the ED with substernal chest pain of one day duration associated with belching.  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  Plan #1 chest pain rule out ACS Patient will be admitted to medical floor on telemetry. His chest pain symptoms appear to be noncardiac as per the history and seem to be more likely due to GERD or Esophageal spasm. However he does have significant risk factor including hypertension,diabetes,  hyperlipidemia and obesity along with a sedentary lifestyle .  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 Clinically he has a very low probability for PE. D-dimer was ordered and it is negative. I will order Protonix and Maalox for possible GERD symptoms Will try to walk in the records off his stress test done as outpatient about a year back. He follows with PCP at [facility].  If patient rules out for  ACS and is chest pain free he can be discharged with outpatient stress test.  Hypertension Stable Continue home medications  Diabetes mellitus Continue metformin Monitor CBG and  sliding scale insulin  Hyperlipidemia Continue Zetia and Crestor  Chronic hypokalemia Patient on a potassium supplement at home which I will continue. monitor labs in the morning  Diet  cardiac diet  DVT prophylaxis subCutaneous Lovenox   Full code  Time Spent on Admission: 45 minutes  Ikia Cincotta 11/05/2011, 12:43 PM

## 2011-11-06 ENCOUNTER — Other Ambulatory Visit: Payer: Self-pay

## 2011-11-06 LAB — BASIC METABOLIC PANEL
Chloride: 105 mEq/L (ref 96–112)
Creatinine, Ser: 0.88 mg/dL (ref 0.50–1.35)
GFR calc Af Amer: >90 mL/min (ref >90)
GFR calc non Af Amer: >90 mL/min (ref >90)
Potassium: 3.4 mEq/L — ABNORMAL LOW (ref 3.5–5.1)

## 2011-11-06 MED ORDER — SODIUM CHLORIDE 0.9 % IV SOLN
500.0000 mL | Freq: Once | INTRAVENOUS | Status: AC
  Administered 2011-11-06: 500 mL via INTRAVENOUS

## 2011-11-06 MED ORDER — GI COCKTAIL ~~LOC~~: 30.0000 mL | Freq: Three times a day (TID) | ORAL | Status: AC | PRN

## 2011-11-06 MED ORDER — ONDANSETRON HCL 4 MG/2ML IJ SOLN
INTRAMUSCULAR | Status: AC
  Filled 2011-11-06: qty 2

## 2011-11-06 MED ORDER — OMEPRAZOLE 20 MG PO CPDR: 20.0000 mg | DELAYED_RELEASE_CAPSULE | Freq: Every day | ORAL | Status: AC

## 2011-11-06 MED ORDER — ONDANSETRON HCL 4 MG/2ML IJ SOLN: 4.0000 mg | Freq: Four times a day (QID) | INTRAMUSCULAR | Status: DC | PRN

## 2011-11-06 NOTE — Progress Notes (Signed)
Pt was given all discharge orders meds RX ,FU care  MD appointments , S/S of chest pain  And education on  This and other related problems pt understood all instructions [address]

## 2011-11-06 NOTE — Discharge Summary (Signed)
Patient ID: Jeremy Reed MRN: [id number] DOB/AGE: 01/30/1961 50 y.o.  Admit date: 11/05/2011 Discharge date: 11/06/2011  Primary Care Physician:  Gillian Scarce, MD  Discharge Diagnoses:    Present on Admission:   *Chest pain at rest likely related to GERD ruled out for ACS  Active Problems:  Hypertension  Diabetes mellitus  Hyperlipidemia Chr hypokalemia   Current Discharge Medication List    START taking these medications   Details  Alum & Mag Hydroxide-Simeth (GI COCKTAIL) SUSP Take 30 mLs by mouth 3 (three) times daily as needed. Qty: 600 mL, Refills: 0    omeprazole (PRILOSEC) 20 MG capsule Take 1 capsule (20 mg total) by mouth daily. Qty: 30 capsule, Refills: 0      CONTINUE these medications which have NOT CHANGED   Details  aspirin 81 MG tablet Take 81 mg by mouth daily.      Cholecalciferol (VITAMIN D HIGH POTENCY PO) Take 1 tablet by mouth daily.      ezetimibe (ZETIA) 10 MG tablet Take 10 mg by mouth daily.      metFORMIN (GLUCOPHAGE) 500 MG tablet Take 1,000 mg by mouth 2 (two) times daily with a meal.     Multiple Vitamins-Minerals (MULTIVITAMINS THER. W/MINERALS) TABS Take 1 tablet by mouth daily.      nebivolol (BYSTOLIC) 10 MG tablet Take 10 mg by mouth daily.      Olmesartan-Amlodipine-HCTZ (TRIBENZOR) 40-10-25 MG TABS Take 1 tablet by mouth daily.      potassium chloride (KLOR-CON) 10 MEQ CR tablet Take 10 mEq by mouth daily.      rosuvastatin (CRESTOR) 10 MG tablet Take 10 mg by mouth daily.      sitaGLIPtin (JANUVIA) 100 MG tablet Take 100 mg by mouth daily.          Disposition and Follow-up:  FOLLOW UP WITH PCP in 1 week.  Consults:   None  Significant Diagnostic Studies:  Dg Chest 2 View  11/05/2011  *RADIOLOGY REPORT*  Clinical Data: Chest pain.  CHEST - 2 VIEW  Comparison: None.  Findings: Cardiopericardial silhouette upper limits of normal for projection.  No airspace disease.  No effusion.  Mediastinal contours are normal.   Trachea midline.  IMPRESSION: No active cardiopulmonary disease.  Original Report Authenticated By: Andreas Newport, M.D.    Brief H and P: For complete details please refer to admission H and P, but in brief 50 year old obese African American male with pasta medical history of hypertension, type 2 diabetes mellitus on metformin, hyperlipidemia who presented to the ED with symptoms of substernal chest pain since yesterday. According to the patient he was in his usual state of health when he woke up yesterday morning at 3 AM to go to work when he started having substernal chest pain which was 4/10 in intensity, pressure-like, nonradiating and worsened to 7 / 10 on deep inspiration. The symptoms lasted for almost 3 hours when he took some ginger ale as he was also having some belching with it. Symptoms subsided during the day however in the evening while he was having his supper he started having those symptoms again and the food felt like a lump with difficulty swallowing it. He stopped eating and went to bed and when his wife woke him up at 3 a.m. Today he started having similar symptoms again. He then called EMS who gave him 325 mg of aspirin and a dose of sublingual nitrates following which his symptoms resolved. He denies having any chest pain at  this time. He denied any associated palpitations or shortness of breath. He denies any nausea or vomiting. Denies any abdominal pain fever or headaches. He informs having some funny symptoms in the chest over a year ago and had a stress test done that was ordered by his PCP and that was reported to be normal. He denies having any history of similar chest pain or history of heart attack in the past. He denies any symptoms of acid reflux. Denies any bowel or urinary symptoms. He denies any calf pain or leg swellings.    Physical Exam on Discharge:  Filed Vitals:   11/05/11 0933 11/05/11 1314 11/05/11 2200 11/06/11 0500  BP: 120/70 166/98 120/61 152/67  Pulse: 71  70 73 64  Temp:  97.8 F (36.6 C) 98 F (36.7 C) 98.1 F (36.7 C)  TempSrc:  Oral    Resp: 16 18 18 18   Height:      Weight:      SpO2: 98% 98% 97% 100%     Intake/Output Summary (Last 24 hours) at 11/06/11 1051 Last data filed at 11/05/11 1700  Gross per 24 hour  Intake    480 ml  Output      0 ml  Net    480 ml    General: Alert, awake, oriented x3, in no acute distress. HEENT: No bruits, no goiter. Heart: Regular rate and rhythm, without murmurs, rubs, gallops. Lungs: Clear to auscultation bilaterally. Abdomen: Soft, nontender, nondistended, positive bowel sounds. Extremities: No clubbing cyanosis or edema with positive pedal pulses. Neuro: Grossly intact, nonfocal.  CBC:    Component Value Date/Time   WBC 8.7 11/05/2011 0445   HGB 14.3 11/05/2011 0459   HCT 42.0 11/05/2011 0459   PLT 173 11/05/2011 0445   MCV 88.0 11/05/2011 0445   NEUTROABS 5.1 11/05/2011 0445   LYMPHSABS 2.5 11/05/2011 0445   MONOABS 1.0 11/05/2011 0445   EOSABS 0.1 11/05/2011 0445   BASOSABS 0.0 11/05/2011 0445    Basic Metabolic Panel:    Component Value Date/Time   NA 141 11/06/2011 0535   K 3.4* 11/06/2011 0535   CL 105 11/06/2011 0535   CO2 29 11/06/2011 0535   BUN 10 11/06/2011 0535   CREATININE 0.88 11/06/2011 0535   GLUCOSE 101* 11/06/2011 0535   CALCIUM 9.0 11/06/2011 0535    Hospital Course:   chest pain rule out ACS  Patient  admitted to medical floor on telemetry.  His chest pain symptoms appear to be noncardiac as per the history and seem to be more likely due to GERD or Esophageal spasm.  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 Clinically  he has a very low probability for PE. D-dimer was ordered and it is negative.  - ordered Protonix and Maalox for possible GERD symptoms  -patient informs of  stress test done as outpatient about a year back. He follows with PCP at [facility]. Patient had some chest discomfort with belching last night . Stable on tele. Given some nitro , morphine and maalox with relief.  He is asymptomatic and has been ruled out for ACS with serial CE and EKG.s table on tele. He can follow  up with his PCP in 1-2 weeks. Patient given scripts for maalox and prilosec. If symptoms persists or worsens he is advised to call his PCP or come to ED. He would benefit from repeat nuclear stress test as outpt if symptoms persist or reoccur. He does not need an inpatient stress test. His remaining medical issues including HTN, DM, HL and hypokalemia were stable and continue don home mediations.   Patients table for discharge home with outpt follow up.    Time spent on Discharge: 45 minutes  Signed: Eddie North 11/06/2011, 10:51 AM

## 2011-11-06 NOTE — Progress Notes (Signed)
Pt with c/o substernal CP 6/10 and "belching." BP 130/82 HR 70.  EKG obtained per standing order and 1 SL ntg given. Pt states pain now 3/10.  BP 112/72-2nd SL ntg given.  BP 103/70. CP now 1-2/10.  2 mg IV morphine given per order.  Pt BP 77/42 manually after morphine.  Pt placed in trendelenburg and 500cc NS bolus started per adult emergency standing orders. Pt BP after 250cc NS bolus 127/76-patient only complaint at this time is nausea.  Bolus discontinued.  Pt also given maalox per PRN orders due to belching. Pt currently CP free.  Night hospitalist notified of CP, hypotension, and possible EKG changes in leads II, III, and aVF.  Orders received.  Will continue to monitor patient.

## 2011-11-08 NOTE — Progress Notes (Signed)
Retro ur ins reivew 

## 2012-02-28 ENCOUNTER — Emergency Department (INDEPENDENT_AMBULATORY_CARE_PROVIDER_SITE_OTHER)

## 2012-02-28 ENCOUNTER — Emergency Department (HOSPITAL_BASED_OUTPATIENT_CLINIC_OR_DEPARTMENT_OTHER)
Admission: EM | Admit: 2012-02-28 | Discharge: 2012-02-28 | Disposition: A | Discharge status: Home / Self Care | Attending: Emergency Medicine | Admitting: Emergency Medicine

## 2012-02-28 ENCOUNTER — Encounter (HOSPITAL_BASED_OUTPATIENT_CLINIC_OR_DEPARTMENT_OTHER): Payer: Self-pay | Admitting: Emergency Medicine

## 2012-02-28 DIAGNOSIS — E119 Type 2 diabetes mellitus without complications: Secondary | ICD-10-CM | POA: Insufficient documentation

## 2012-02-28 DIAGNOSIS — M25519 Pain in unspecified shoulder: Secondary | ICD-10-CM | POA: Insufficient documentation

## 2012-02-28 DIAGNOSIS — I10 Essential (primary) hypertension: Secondary | ICD-10-CM | POA: Insufficient documentation

## 2012-02-28 DIAGNOSIS — E7800 Pure hypercholesterolemia, unspecified: Secondary | ICD-10-CM | POA: Insufficient documentation

## 2012-02-28 DIAGNOSIS — M436 Torticollis: Secondary | ICD-10-CM

## 2012-02-28 DIAGNOSIS — M62838 Other muscle spasm: Secondary | ICD-10-CM | POA: Insufficient documentation

## 2012-02-28 DIAGNOSIS — E785 Hyperlipidemia, unspecified: Secondary | ICD-10-CM | POA: Insufficient documentation

## 2012-02-28 DIAGNOSIS — M542 Cervicalgia: Secondary | ICD-10-CM | POA: Insufficient documentation

## 2012-02-28 MED ORDER — CYCLOBENZAPRINE HCL 10 MG PO TABS: 10.0000 mg | ORAL_TABLET | Freq: Two times a day (BID) | ORAL | Status: AC | PRN

## 2012-02-28 MED ORDER — KETOROLAC TROMETHAMINE 60 MG/2ML IM SOLN
60.0000 mg | Freq: Once | INTRAMUSCULAR | Status: AC
  Administered 2012-02-28: 60 mg via INTRAMUSCULAR
  Filled 2012-02-28: qty 2

## 2012-02-28 MED ORDER — HYDROCODONE-ACETAMINOPHEN 5-500 MG PO TABS: 1.0000 | ORAL_TABLET | Freq: Four times a day (QID) | ORAL | Status: AC | PRN

## 2012-02-28 NOTE — ED Notes (Addendum)
Pt c/o LT side neck pain (sometimes on RT as well) x 2 weeks; reports missed a step while getting off truck 2 weeks ago- has seen PMD, rx for "gel" and ibuprofen, which helped at first, but pain returned 2 days ago

## 2012-02-28 NOTE — ED Provider Notes (Addendum)
History     CSN: [id number]  Arrival date & time 02/28/12  1009   First MD Initiated Contact with Patient 02/28/12 1024      Chief Complaint  Patient presents with  . Neck Pain    (Consider location/radiation/quality/duration/timing/severity/associated sxs/prior treatment) Patient is a 51 y.o. male presenting with neck pain. The history is provided by the patient.  Neck Pain  This is a new problem. Episode onset: 2 weeks ago. The problem occurs constantly. The problem has been gradually worsening. The pain is associated with twisting (Was getting out of his truck and stepped wrong with his head turned and felt a pop in his neck that initially did not hurt). There has been no fever. The pain is present in the left side. The quality of the pain is described as stabbing and shooting. The pain radiates to the left shoulder. The pain is at a severity of 9/10. The pain is severe. The symptoms are aggravated by twisting and position. The pain is the same all the time. Stiffness is present all day. Pertinent negatives include no visual change, no numbness, no leg pain, no tingling and no weakness. He has tried ice and NSAIDs for the symptoms. Improvement on treatment: Paste made the pain worse. Initially ibuprofen helped but now pain is getting worse.    Past Medical History  Diagnosis Date  . Hypertension   . Hyperlipidemia   . Diabetes mellitus   . Hypercholesteremia   . Angina   . Headache     Past Surgical History  Procedure Date  . Back surgery   . Ankle fracture surgery 1980  . Carpel 2003    carpel tunnel surgery-bilateral hands    No family history on file.  History  Substance Use Topics  . Smoking status: Never Smoker   . Smokeless tobacco: Not on file  . Alcohol Use: No      Review of Systems  HENT: Positive for neck pain.   Neurological: Negative for tingling, weakness and numbness.  All other systems reviewed and are negative.    Allergies  Review of  patient's allergies indicates no known allergies.  Home Medications   Current Outpatient Rx  Name Route Sig Dispense Refill  . POTASSIUM CHLORIDE 10 MEQ PO TBCR Oral Take 10 mEq by mouth daily.      Marland Kitchen GI COCKTAIL [place] Oral Take 30 mLs by mouth 3 (three) times daily as needed. 600 mL 0  . ASPIRIN 81 MG PO TABS Oral Take 81 mg by mouth daily.      Marland Kitchen VITAMIN D HIGH POTENCY PO Oral Take 1 tablet by mouth daily.      Marland Kitchen EZETIMIBE 10 MG PO TABS Oral Take 10 mg by mouth daily.      Marland Kitchen METFORMIN HCL 500 MG PO TABS Oral Take 1,000 mg by mouth 2 (two) times daily with a meal.     . THERA M PLUS PO TABS Oral Take 1 tablet by mouth daily.      . NEBIVOLOL HCL 10 MG PO TABS Oral Take 10 mg by mouth daily.      Marland Kitchen OLMESARTAN-AMLODIPINE-HCTZ 40-10-25 MG PO TABS Oral Take 1 tablet by mouth daily.      Marland Kitchen OMEPRAZOLE 20 MG PO CPDR Oral Take 1 capsule (20 mg total) by mouth daily. 30 capsule 0  . ROSUVASTATIN CALCIUM 10 MG PO TABS Oral Take 10 mg by mouth daily.      Marland Kitchen SITAGLIPTIN PHOSPHATE 100 MG  PO TABS Oral Take 100 mg by mouth daily.        BP 152/89  Pulse 69  Temp(Src) 98.1 F (36.7 C) (Oral)  Resp 16  SpO2 99%  Physical Exam  Nursing note and vitals reviewed. Constitutional: He appears well-developed and well-nourished. No distress.  HENT:  Head: Normocephalic and atraumatic.  Mouth/Throat: Oropharynx is clear and moist.  Eyes: EOM are normal. Pupils are equal, round, and reactive to light.  Neck: Neck supple. Muscular tenderness present. No spinous process tenderness present. Decreased range of motion present.    Musculoskeletal:       Cervical back: He exhibits pain and spasm. He exhibits no bony tenderness.  Neurological: He is alert. He has normal strength. No sensory deficit.  Skin: Skin is warm and dry. No rash noted.  Psychiatric: He has a normal mood and affect. His behavior is normal.    ED Course  Procedures (including critical care time)  Labs Reviewed - No data to  display Dg Cervical Spine Complete  02/28/2012  *RADIOLOGY REPORT*  Clinical Data: Left-sided neck pain and stiffness.  The patient denies any trauma.  CERVICAL SPINE - COMPLETE 4+ VIEW  Comparison: None available.  Findings: The cervical spine is imaged from skull base through the cervicothoracic junction. The prevertebral soft tissues are within normal limits.  No acute fracture or traumatic subluxation is evident.  The foramina appear to be patent bilaterally.  The lung apices are clear.  IMPRESSION:  1.  No acute or focal abnormality to explain the patient's symptoms.  Original Report Authenticated By: Jamesetta Orleans. MATTERN, M.D.     1. Cervical paraspinal muscle spasm       MDM   Patient with neck pain now for 2 weeks that started after he stepped on while getting out of his truck and felt a pop in his neck. Initially he was using ibuprofen and it which helped but the neck pain is now getting worse. On exam patient has a muscle spasm on the left paracervical spine. He has no central spinal tenderness however given his history of a pop in his neck will get a plain film to ensure normal alignment.  Patient given IM Toradol for pain control as he was driving.  11:17 AM Plain films within normal limits. Patient to follow up with his regular doctor     Gwyneth Sprout, MD 02/28/12 1117  Gwyneth Sprout, MD 02/28/12 1119

## 2012-11-09 IMAGING — CR DG CERVICAL SPINE COMPLETE 4+V
6 series · 6 of 6 positions shown · non-contrast
Comparison: None available.

CLINICAL DATA: Left-sided neck pain and stiffness.  The patient
denies any trauma.

CERVICAL SPINE - COMPLETE 4+ VIEW

[w c-spine lat]
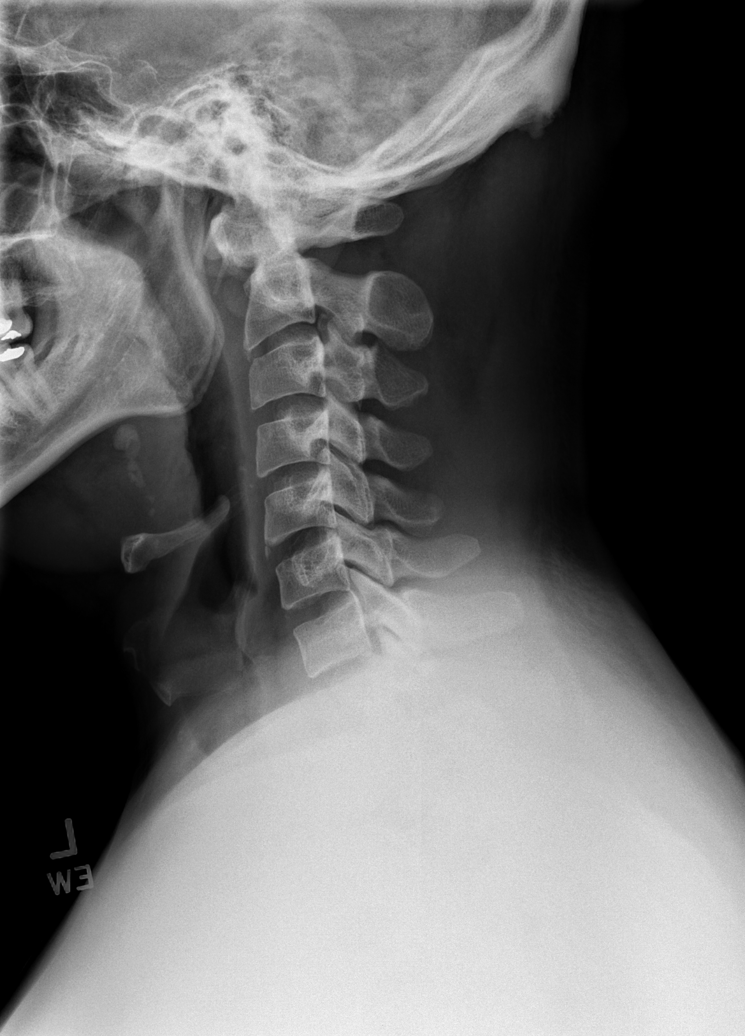

[w c-spine oblique (1 of 2)]
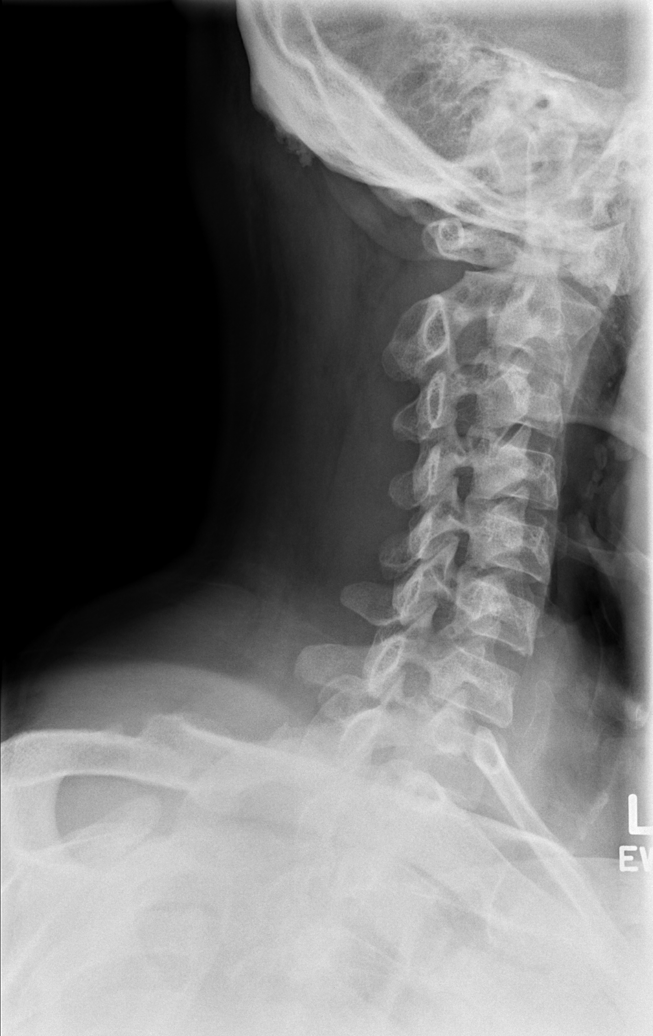

[w c-spine oblique (2 of 2)]
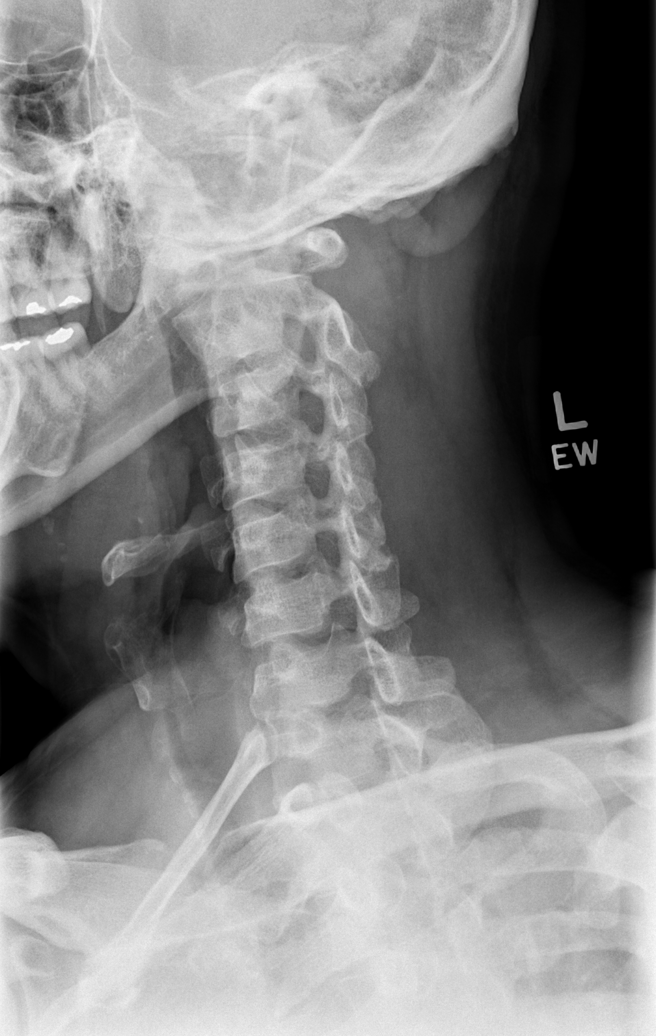

[w c-spine a.p.]
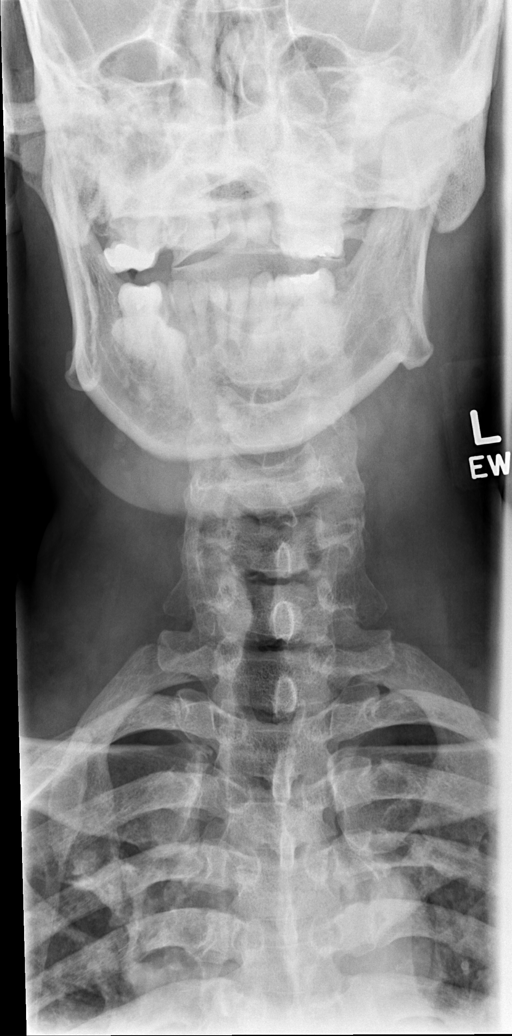

[w c-spine odontoid * (1 of 2)]
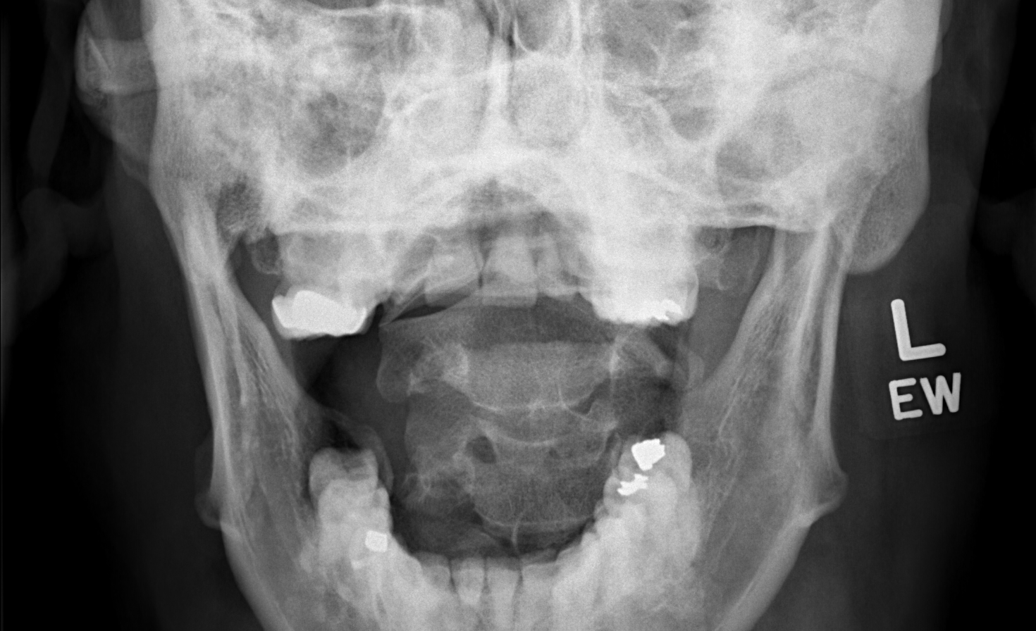

[w c-spine odontoid * (2 of 2)]
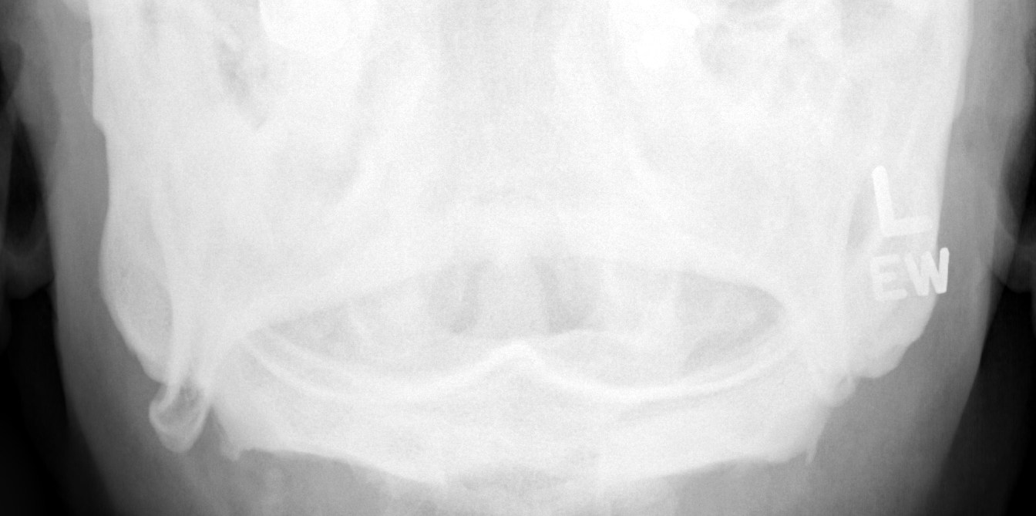

[6 of 6 positions shown; findings below may reference images not displayed]

FINDINGS: The cervical spine is imaged from skull base through the
cervicothoracic junction. The prevertebral soft tissues are within
normal limits.  No acute fracture or traumatic subluxation is
evident.  The foramina appear to be patent bilaterally.  The lung
apices are clear.
IMPRESSION: 1.  No acute or focal abnormality to explain the patient's
symptoms.

## 2012-11-28 ENCOUNTER — Encounter (HOSPITAL_BASED_OUTPATIENT_CLINIC_OR_DEPARTMENT_OTHER): Payer: Self-pay | Admitting: *Deleted

## 2012-11-28 ENCOUNTER — Emergency Department (HOSPITAL_BASED_OUTPATIENT_CLINIC_OR_DEPARTMENT_OTHER)
Admission: EM | Admit: 2012-11-28 | Discharge: 2012-11-28 | Disposition: A | Discharge status: Home / Self Care | Attending: Emergency Medicine | Admitting: Emergency Medicine

## 2012-11-28 DIAGNOSIS — Z79899 Other long term (current) drug therapy: Secondary | ICD-10-CM | POA: Insufficient documentation

## 2012-11-28 DIAGNOSIS — I10 Essential (primary) hypertension: Secondary | ICD-10-CM | POA: Insufficient documentation

## 2012-11-28 DIAGNOSIS — E7800 Pure hypercholesterolemia, unspecified: Secondary | ICD-10-CM | POA: Insufficient documentation

## 2012-11-28 DIAGNOSIS — E119 Type 2 diabetes mellitus without complications: Secondary | ICD-10-CM | POA: Insufficient documentation

## 2012-11-28 DIAGNOSIS — H9209 Otalgia, unspecified ear: Secondary | ICD-10-CM | POA: Insufficient documentation

## 2012-11-28 DIAGNOSIS — Z8679 Personal history of other diseases of the circulatory system: Secondary | ICD-10-CM | POA: Insufficient documentation

## 2012-11-28 DIAGNOSIS — E785 Hyperlipidemia, unspecified: Secondary | ICD-10-CM | POA: Insufficient documentation

## 2012-11-28 DIAGNOSIS — Z7982 Long term (current) use of aspirin: Secondary | ICD-10-CM | POA: Insufficient documentation

## 2012-11-28 DIAGNOSIS — R51 Headache: Secondary | ICD-10-CM | POA: Insufficient documentation

## 2012-11-28 DIAGNOSIS — R519 Headache, unspecified: Secondary | ICD-10-CM

## 2012-11-28 LAB — GLUCOSE, CAPILLARY: Glucose-Capillary: 79 mg/dL (ref 70–99)

## 2012-11-28 MED ORDER — KETOROLAC TROMETHAMINE 30 MG/ML IJ SOLN
30.0000 mg | Freq: Once | INTRAMUSCULAR | Status: AC
  Administered 2012-11-28: 30 mg via INTRAMUSCULAR
  Filled 2012-11-28: qty 1

## 2012-11-28 MED ORDER — ACETAMINOPHEN-CODEINE #3 300-30 MG PO TABS: 1.0000 | ORAL_TABLET | Freq: Four times a day (QID) | ORAL | Status: AC | PRN

## 2012-11-28 MED ORDER — PENICILLIN V POTASSIUM 500 MG PO TABS: 500.0000 mg | ORAL_TABLET | Freq: Three times a day (TID) | ORAL | Status: AC

## 2012-11-28 NOTE — ED Notes (Signed)
Pt describes right side facial pain onset last p.m. Hx similar episode in 1996 "blocked saliva gland" PERL. Right side tongue "sore". Comes and goes. No neuro deficits noted.

## 2012-11-28 NOTE — Discharge Instructions (Signed)
General Headache Without Cause A headache is pain or discomfort felt around the head or neck area. The specific cause of a headache may not be found. There are many causes and types of headaches. A few common ones are:  Tension headaches.  Migraine headaches.  Cluster headaches.  Chronic daily headaches. HOME CARE INSTRUCTIONS   Keep all follow-up appointments with your caregiver or any specialist referral.  Only take over-the-counter or prescription medicines for pain or discomfort as directed by your caregiver.  Lie down in a dark, quiet room when you have a headache.  Keep a headache journal to find out what may trigger your migraine headaches. For example, write down:  What you eat and drink.  How much sleep you get.  Any change to your diet or medicines.  Try massage or other relaxation techniques.  Put ice packs or heat on the head and neck. Use these 3 to 4 times per day for 15 to 20 minutes each time, or as needed.  Limit stress.  Sit up straight, and do not tense your muscles.  Quit smoking if you smoke.  Limit alcohol use.  Decrease the amount of caffeine you drink, or stop drinking caffeine.  Eat and sleep on a regular schedule.  Get 7 to 9 hours of sleep, or as recommended by your caregiver.  Keep lights dim if bright lights bother you and make your headaches worse. SEEK MEDICAL CARE IF:   You have problems with the medicines you were prescribed.  Your medicines are not working.  You have a change from the usual headache.  You have nausea or vomiting. SEEK IMMEDIATE MEDICAL CARE IF:   Your headache becomes severe.  You have a fever.  You have a stiff neck.  You have loss of vision.  You have muscular weakness or loss of muscle control.  You start losing your balance or have trouble walking.  You feel faint or pass out.  You have severe symptoms that are different from your first symptoms. MAKE SURE YOU:   Understand these  instructions.  Will watch your condition.  Will get help right away if you are not doing well or get worse. Document Released: 11/14/2005 Document Revised: 02/06/2012 Document Reviewed: 11/30/2011 Exit[employer] Patient Information 2013 [employer].  

## 2012-11-28 NOTE — ED Provider Notes (Signed)
History     CSN: [id number]  Arrival date & time 11/28/12  4098   First MD Initiated Contact with Patient 11/28/12 2120      Chief Complaint  Patient presents with  . Facial Pain    (Consider location/radiation/quality/duration/timing/severity/associated sxs/prior treatment) HPI Comments: Pt with h/o DM, complaints of intermittent pain to right side of face and head involving mostly right cheek, lateral to eye and right side of forehead since yesterday.  Pt reports similar to an episode many years ago and told blocked salivary gland.  No fever, no stiff neck, no rash.  Denies facial droop, numbness or weakness.  Pt reports pain is intermittent lasting several minutes to about an hour at a time.  Sometimes more painful it seems to eating and chewing.  Pain radiates to ear at times and feels sharp.  Has not taken anything in specific at home for pain.  The history is provided by the patient. No language interpreter was used.    Past Medical History  Diagnosis Date  . Hypertension   . Hyperlipidemia   . Diabetes mellitus   . Hypercholesteremia   . Angina   . Headache     Past Surgical History  Procedure Date  . Back surgery   . Ankle fracture surgery 1980  . Carpel 2003    carpel tunnel surgery-bilateral hands    History reviewed. No pertinent family history.  History  Substance Use Topics  . Smoking status: Never Smoker   . Smokeless tobacco: Not on file  . Alcohol Use: No      Review of Systems  Constitutional: Negative for fever and chills.  HENT: Positive for ear pain and dental problem. Negative for sore throat, facial swelling, mouth sores, neck pain, neck stiffness, sinus pressure and ear discharge.   Eyes: Negative for pain and visual disturbance.  Respiratory: Negative for cough and shortness of breath.   Cardiovascular: Negative for chest pain.  Neurological: Positive for facial asymmetry and headaches. Negative for dizziness and weakness.    Allergies    Review of patient's allergies indicates no known allergies.  Home Medications   Current Outpatient Rx  Name  Route  Sig  Dispense  Refill  . ACETAMINOPHEN-CODEINE #3 300-30 MG PO TABS   Oral   Take 1-2 tablets by mouth every 6 (six) hours as needed for pain.   15 tablet   0   . GI COCKTAIL [place]   Oral   Take 30 mLs by mouth 3 (three) times daily as needed.   600 mL   0   . ASPIRIN 81 MG PO TABS   Oral   Take 81 mg by mouth daily.           Marland Kitchen VITAMIN D HIGH POTENCY PO   Oral   Take 1 tablet by mouth daily.           Marland Kitchen EZETIMIBE 10 MG PO TABS   Oral   Take 10 mg by mouth daily.           Marland Kitchen METFORMIN HCL 500 MG PO TABS   Oral   Take 1,000 mg by mouth 2 (two) times daily with a meal.          . THERA M PLUS PO TABS   Oral   Take 1 tablet by mouth daily.           . NEBIVOLOL HCL 10 MG PO TABS   Oral   Take 10 mg  by mouth daily.           Marland Kitchen OLMESARTAN-AMLODIPINE-HCTZ 40-10-25 MG PO TABS   Oral   Take 1 tablet by mouth daily.           Marland Kitchen OMEPRAZOLE 20 MG PO CPDR   Oral   Take 1 capsule (20 mg total) by mouth daily.   30 capsule   0   . PENICILLIN V POTASSIUM 500 MG PO TABS   Oral   Take 1 tablet (500 mg total) by mouth 3 (three) times daily.   21 tablet   0   . POTASSIUM CHLORIDE 10 MEQ PO TBCR   Oral   Take 10 mEq by mouth daily.           Marland Kitchen ROSUVASTATIN CALCIUM 10 MG PO TABS   Oral   Take 10 mg by mouth daily.           Marland Kitchen SITAGLIPTIN PHOSPHATE 100 MG PO TABS   Oral   Take 100 mg by mouth daily.             BP 155/94  Pulse 72  Temp 98.6 F (37 C) (Oral)  Resp 20  Ht 5\' 9"  (1.753 m)  Wt 260 lb (117.935 kg)  BMI 38.40 kg/m2  SpO2 96%  Physical Exam  Nursing note and vitals reviewed. Constitutional: He appears well-developed and well-nourished.  HENT:  Head: Normocephalic and atraumatic.  Right Ear: External ear normal.  Mouth/Throat: Uvula is midline, oropharynx is clear and moist and mucous membranes are  normal. Dental caries present. No uvula swelling. No oropharyngeal exudate.  Eyes: EOM are normal. Pupils are equal, round, and reactive to light. Right eye exhibits no discharge. Left eye exhibits no discharge.  Neck: Normal range of motion. Neck supple. No tracheal deviation present. No thyromegaly present.  Cardiovascular: Normal rate.   Pulmonary/Chest: Effort normal. No stridor. No respiratory distress.  Abdominal: Soft. He exhibits no distension.  Musculoskeletal: He exhibits no edema.  Lymphadenopathy:    He has no cervical adenopathy.  Neurological: He is alert.  Skin: Skin is warm and dry. No rash noted.    ED Course  Procedures (including critical care time)  Labs Reviewed  GLUCOSE, CAPILLARY - Abnormal; Notable for the following:    Glucose-Capillary 54 (*)     All other components within normal limits  GLUCOSE, CAPILLARY   No results found.   1. Facial pain     ra sat is 96% and i interpret to be normal.  Pt was initially mildly hypoglycemic with h/o DM, given something to eat and improved.    MDM  No focal deficits.  No airway difficulty or abn lung sounds. No asymetry to face.  No tenderness or edema noted to right parotid.  Tender along right upper dentition.  Will treat with analgesic and PCN for possibly dental infection and pain and referred to PCP for follow up.          Gavin Pound. Jomayra Novitsky, MD 11/29/12 1721

## 2012-11-28 NOTE — ED Notes (Signed)
CBG-54mg Excell Seltzer

## 2015-04-13 ENCOUNTER — Emergency Department: Admit: 2015-04-14 | Payer: TRICARE (CHAMPUS) | Primary: Student in an Organized Health Care Education/Training Program

## 2015-04-13 ENCOUNTER — Emergency Department: Admit: 2015-04-13 | Payer: TRICARE (CHAMPUS) | Primary: Student in an Organized Health Care Education/Training Program

## 2015-04-13 ENCOUNTER — Inpatient Hospital Stay
Admit: 2015-04-13 | Discharge: 2015-04-14 | Disposition: A | Discharge status: Home / Self Care | Payer: TRICARE (CHAMPUS) | Attending: Emergency Medicine

## 2015-04-13 DIAGNOSIS — R51 Headache: Secondary | ICD-10-CM

## 2015-04-13 LAB — CBC WITH AUTOMATED DIFF
BASOPHILS: 0.1 % (ref 0–3)
EOSINOPHILS: 1.1 % (ref 0–5)
HCT: 44.3 % (ref 37.0–50.0)
HGB: 14.6 gm/dl (ref 12.4–17.2)
IMMATURE GRANULOCYTES: 0.3 % (ref 0.0–3.0)
LYMPHOCYTES: 21.6 % — ABNORMAL LOW (ref 28–48)
MCH: 29.3 pg (ref 23.0–34.6)
MCHC: 33 gm/dl (ref 30.0–36.0)
MCV: 88.8 fL (ref 80.0–98.0)
MONOCYTES: 9.6 % (ref 1–13)
MPV: 10.6 fL — ABNORMAL HIGH (ref 6.0–10.0)
NEUTROPHILS: 67.3 % — ABNORMAL HIGH (ref 34–64)
NRBC: 0 (ref 0–0)
PLATELET: 189 10*3/uL (ref 140–450)
RBC: 4.99 M/uL (ref 3.80–5.70)
RDW-SD: 47.2 — ABNORMAL HIGH (ref 35.1–43.9)
WBC: 7.9 10*3/uL (ref 4.0–11.0)

## 2015-04-13 LAB — METABOLIC PANEL, BASIC
BUN: 18 mg/dl (ref 7–25)
CO2: 29 mEq/L (ref 21–32)
Calcium: 8.9 mg/dl (ref 8.5–10.1)
Chloride: 101 mEq/L (ref 98–107)
Creatinine: 1.1 mg/dl (ref 0.6–1.3)
GFR est AA: >60
GFR est non-AA: >60
Glucose: 118 mg/dl — ABNORMAL HIGH (ref 74–106)
Potassium: 3.2 mEq/L — ABNORMAL LOW (ref 3.5–5.1)
Sodium: 138 mEq/L (ref 136–145)

## 2015-04-13 LAB — GLUCOSE, POC: Glucose (POC): 141 mg/dL — ABNORMAL HIGH (ref 65–105)

## 2015-04-13 LAB — SED RATE (ESR): Sed rate (ESR): 24 mm/Hr — ABNORMAL HIGH (ref 0–20)

## 2015-04-13 MED ORDER — ONDANSETRON (PF) 4 MG/2 ML INJECTION
4 mg/2 mL | Freq: Once | INTRAMUSCULAR | Status: AC
Start: 2015-04-13 — End: 2015-04-13
  Administered 2015-04-14: via INTRAVENOUS

## 2015-04-13 MED ORDER — SODIUM CHLORIDE 0.9 % IJ SYRG
Freq: Once | INTRAMUSCULAR | Status: AC
Start: 2015-04-13 — End: 2015-04-13
  Administered 2015-04-13: 22:00:00 via INTRAVENOUS

## 2015-04-13 MED ORDER — ACETAMINOPHEN 325 MG TABLET
325 mg | ORAL | Status: DC
Start: 2015-04-13 — End: 2015-04-13

## 2015-04-13 MED ORDER — DIPHENHYDRAMINE HCL 50 MG/ML IJ SOLN
50 mg/mL | Freq: Once | INTRAMUSCULAR | Status: AC
Start: 2015-04-13 — End: 2015-04-13
  Administered 2015-04-13: 22:00:00 via INTRAVENOUS

## 2015-04-13 MED ORDER — SODIUM CHLORIDE 0.9% BOLUS IV
0.9 % | INTRAVENOUS | Status: AC
Start: 2015-04-13 — End: 2015-04-13
  Administered 2015-04-13: 22:00:00 via INTRAVENOUS

## 2015-04-13 MED ORDER — PROCHLORPERAZINE EDISYLATE 5 MG/ML INJECTION
5 mg/mL | INTRAMUSCULAR | Status: AC
Start: 2015-04-13 — End: 2015-04-13
  Administered 2015-04-13: 22:00:00 via INTRAVENOUS

## 2015-04-13 MED FILL — DIPHENHYDRAMINE HCL 50 MG/ML IJ SOLN: 50 mg/mL | INTRAMUSCULAR | Qty: 1

## 2015-04-13 MED FILL — BD POSIFLUSH NORMAL SALINE 0.9 % INJECTION SYRINGE: INTRAMUSCULAR | Qty: 10

## 2015-04-13 MED FILL — SODIUM CHLORIDE 0.9 % INJECTION: INTRAMUSCULAR | Qty: 10

## 2015-04-13 NOTE — Other (Signed)
11:46 PM  04/13/2015     Discharge instructions given to pt (name) with verbalization of understanding. Patient accompanied by family.  Patient discharged with the following prescriptions zofran and fiorocet. Patient discharged to home (destination).      Bynum BellowsLISA SHOEMAKER, RN

## 2015-04-13 NOTE — ED Notes (Addendum)
Pt returned from X-ray after LP under fluroscopy. Pt awake, alert. Denies headache at this time. instructed on post LP recommendations for bedrest and lying flat. Pt laying flat in bed at this time.  Family at bedside.

## 2015-04-13 NOTE — ED Notes (Signed)
Pt to Radiology.

## 2015-04-13 NOTE — ED Notes (Signed)
Pt returned from Radiology.

## 2015-04-13 NOTE — ED Notes (Signed)
Pt reports R sided headache, R jaw pain x 2 days.  Pt took ibuprofen at noon without relief.  Pt Diabetic/unsure of blood glucose.

## 2015-04-13 NOTE — ED Notes (Signed)
Pt improving. Medicated with Zofran per order. Dr. Truddie Crumblestambaugh [facility] without success. Repeat BP 101/65  HR 73

## 2015-04-13 NOTE — ED Notes (Signed)
Symptoms since yesterday. Sent from pt first

## 2015-04-13 NOTE — ED Notes (Signed)
Assisting Farrel DemarkDiana Houle PAC during LP on pt. Pt sitting up leaning over bedside table and became nauseous, diaphoretic, dizzy during procedure. BP 82/56 HR 68 O2 sat 92%. Pt layed flat in bed on side with improvement of symptoms.

## 2015-04-13 NOTE — Progress Notes (Signed)
Interventional Radiologist: Daphane ShepherdSANJAY M Consuello Lassalle, MD    Pre-operative Diagnosis: Headache    Post-operative Diagnosis: Same as pre-operative diagnosis    Procedure(s) Performed:  Fluoroscopic Guided Lumbar Puncture    Anesthesia:  Local    Findings:    Informed consent.  Lumbar puncture performed with fluorscopic guidance at [facility]L3 using 20 g spinal needle    Complications: No immediate    Estimated Blood Loss:  None    Specimens: 11 cc clear CSF    Daphane ShepherdSANJAY M Jasline Buskirk, MD  Apr 13, 2015  10:09 PM

## 2015-04-13 NOTE — ED Notes (Signed)
MD at pts bedside

## 2015-04-13 NOTE — ED Provider Notes (Signed)
[facility]  EMERGENCY DEPART[facility] TREATMENT REPORT  NAME:  Eisenhour, Patryck  SEX:   M  ADMIT: 04/13/2015  DOB:   1961/01/28  MR#    [id number]  ROOM:  [id number]  TIME DICTATED: 11 12 PM  ACCT#  [id number]        CHIEF COMPLAINT:  Headache.    HISTORY OF PRESENT ILLNESS:  This is a 54 year old male who presents with headache that started yesterday   right temple.  It has been intermittent.  It came on rather suddenly after he   was leaving church, associated with some photophobia.  It feels like an   expanding Kleist-like pain in the right temple.  It feels like it is just   radiating to his jaw.  He took ibuprofen yesterday, it went away, but then it   returned again this morning.  Not associated with any focal weakness, no   diplopia, no nausea, no vomiting.  The patient denies any history of similar   headaches previously.  No trauma to the hand.  No fever.  Rates his pain as a   6 on a scale of 1 to 10.    REVIEW OF SYSTEMS:  CONSTITUTIONAL:  No fever.  EYES:  Positive photophobia.  ENT:  No decreased hearing.  No sore throat.  RESPIRATORY:  No difficulty breathing.  CARDIOVASCULAR:  No chest pain.  GASTROINTESTINAL:  No vomiting, no abdominal pain.  GENITOURINARY:  No dysuria, frequency, or urgency.   MUSCULOSKELETAL:  No neck pain.  INTEGUMENTARY:  No rash.  NEUROLOGICAL:  Positive headache.  No focal weakness.    PAST MEDICAL HISTORY:  The patient has hypertension, hypercholesterolemia, GERD, history of diabetes   type 2.    SOCIAL HISTORY:  [place] lives in [place] but works here, drives a truck locally.    FAMILY HISTORY:  Negative for migraines.  Positive for mother had cardiac disease and diabetes.    Positive family history of hypertension.    PHYSICAL EXAMINATION:  VITAL SIGNS:  Blood pressure 134/84, respiratory rate 18, temperature 98.8.  GENERAL:  Well-developed, well-nourished male, alert.  HEENT:  Eyes:  Conjunctivae clear, lids normal.  Pupils equal, symmetrical,    and normally reactive.  EOMs intact.  No facial droop.  Tenderness noted right   temporal region.  Tongue and uvula midline.  NECK:  Supple, nontender, symmetrical, no masses or JVD, trachea midline.    Thyroid not enlarged, nodular, or tender.  2+ carotid pulses bilaterally.  No   bruits heard.  LYMPHATIC:   No cervical or submandibular lymphadenopathy palpated.   HEART:  Sinus rhythm.  No murmurs heard.  LUNGS:  Clear, no wheezing, no retractions, no respiratory distress.  ABDOMEN:  Soft, no tenderness, no guarding, no rebound, no mass palpable.  MUSCULOSKELETAL:  Neck:  No nuchal rigidity.  NEUROLOGIC:  Equal strength bilaterally.  Normal finger-to-nose.  Negative   drift upper and lower extremities.    IMPRESSION AND MANAGEMENT PLAN:  This patient who presents with headache; this is atypical for him.  He has   some tenderness as well right temporal region.  He has no dental tenderness or   abscess.  This could be some temporal arteritis.  With him not having history   of similar headaches, we will scan his head, medicate for pain and obtain a   Sed rate on the patient.    CONTINUATION BY DIANA HOULE, PA:     DIAGNOSTIC INTERPRETATION:  CBC within normal limits.  Chemistry obtained and potassium slightly low at   3.2, otherwise within normal limits.  Sed rate was 27.  CT of the head was   obtained which was read by radiologist as negative.    COURSE IN THE EMERGENCY DEPARTMENT:  The patient given Benadryl, Compazine with improvement of headache. He still   had some nausea, Zofran given.  I discussed with the patient that this is   atypical headache for him, sudden onset, and concern is for subarachnoid   hemorrhage.  Consent for LP was obtained.    PROCEDURE NOTE:  I attempted as well as Dr. Truddie CrumbleStambaugh attempted LP without success.  Back was   cleansed with Betadine.  Sterile drapes placed, anesthetized with 2%   Xylocaine. A 22-gauge 3.5 LP Quincke needle was used, was unsuccessful.    Discussion with interventional radiology and the patient was sent to radiology   for LP, where it was performed under fluoroscopy.    DIAGNOSTIC INTERPRETATION:  LP fluid shows of 425 red cells in tube 1, 40 in tube 4, and 1 white cell in   each.  Protein is 45.6, glucose of 71.    COURSE IN THE EMERGENCY DEPARTMENT:     The patient feeling better, advised of the results.    FINAL DIAGNOSIS:  Acute cephalgia.    DISPOSITION:   The patient discharged with prescription for Fioricet and Zofran, primary care   referral given.  The patient evaluated by myself and Dr. Truddie CrumbleStambaugh who agrees   with the above assessment and plan.      ___________________  Posey Prontoobert E Daimien Patmon MD  Dictated By: Windell Hummingbirdiana A. Houle, PA    My signature above authenticates this document and my orders, the final   diagnosis (es), discharge prescription (s), and instructions in the Epic   record.  If you have any questions please contact [phone].    Nursing notes have been reviewed by the physician/ advanced practice   clinician.    JM  D:04/13/2015 23:12:41  T: 04/13/2015 23:42:37  [id number]

## 2015-04-14 LAB — GLUCOSE, CSF: Glucose,CSF: 71 mg/dl — ABNORMAL HIGH (ref 40–70)

## 2015-04-14 LAB — PROTEIN, CSF: Protein,CSF: 45.6 mg/dl — ABNORMAL HIGH (ref 15.0–45.0)

## 2015-04-14 LAB — CELL COUNT, CSF
CSF RBCs: 40 /mm3 — ABNORMAL HIGH (ref 0–10)
CSF RBCs: 425 /mm3 — ABNORMAL HIGH (ref 0–10)
CSF WBCs: 1 /mm3 (ref 0–10)
CSF WBCs: 1 /mm3 (ref 0–10)

## 2015-04-14 MED ORDER — LIDOCAINE HCL 1 % (10 MG/ML) IJ SOLN
10 mg/mL (1 %) | Freq: Once | INTRAMUSCULAR | Status: DC
Start: 2015-04-14 — End: 2015-04-14

## 2015-04-14 MED ORDER — ONDANSETRON HCL 4 MG TAB
4 mg | ORAL_TABLET | Freq: Three times a day (TID) | ORAL | Status: AC | PRN
Start: 2015-04-14 — End: ?

## 2015-04-14 MED ORDER — BUTALBITAL-ACETAMINOPHEN-CAFFEINE 50 MG-325 MG-40 MG TAB
50-325-40 mg | ORAL_TABLET | Freq: Four times a day (QID) | ORAL | Status: AC | PRN
Start: 2015-04-14 — End: 2015-04-23

## 2015-04-14 MED FILL — ACETAMINOPHEN 325 MG TABLET: 325 mg | ORAL | Qty: 3

## 2015-04-14 MED FILL — ONDANSETRON (PF) 4 MG/2 ML INJECTION: 4 mg/2 mL | INTRAMUSCULAR | Qty: 2

## 2015-04-18 LAB — CULTURE, CSF W GRAM STAIN: Culture result: NO GROWTH

## 2020-02-15 ENCOUNTER — Ambulatory Visit: Attending: Internal Medicine

## 2020-02-15 ENCOUNTER — Other Ambulatory Visit: Payer: Self-pay

## 2020-02-15 DIAGNOSIS — Z23 Encounter for immunization: Secondary | ICD-10-CM

## 2020-02-15 NOTE — Progress Notes (Signed)
   Covid-19 Vaccination Clinic  Name:  Jaivion Kingsley    MRN: [id number] DOB: 1961/01/31  02/15/2020  Mr. Catino was observed post Covid-19 immunization for 15 minutes without incident. He was provided with Vaccine Information Sheet and instruction to access the V-Safe system.   Mr. Hillyard was instructed to call 911 with any severe reactions post vaccine: Marland Kitchen Difficulty breathing  . Swelling of face and throat  . A fast heartbeat  . A bad rash all over body  . Dizziness and weakness   Immunizations Administered    Name Date Dose VIS Date Route   Pfizer COVID-19 Vaccine 02/15/2020 12:13 PM 0.3 mL 11/08/2019 Intramuscular   Manufacturer: [employer], [employer]   Lot: [id number]   NDC: [address]-9

## 2020-03-10 ENCOUNTER — Ambulatory Visit: Attending: Internal Medicine

## 2020-03-10 DIAGNOSIS — Z23 Encounter for immunization: Secondary | ICD-10-CM

## 2020-03-10 NOTE — Progress Notes (Signed)
   Covid-19 Vaccination Clinic  Name:  Braxston Quinter    MRN: [id number] DOB: 1961-01-26  03/10/2020  Mr. Fortunato was observed post Covid-19 immunization for 15 minutes without incident. He was provided with Vaccine Information Sheet and instruction to access the V-Safe system.   Mr. Raju was instructed to call 911 with any severe reactions post vaccine: Marland Kitchen Difficulty breathing  . Swelling of face and throat  . A fast heartbeat  . A bad rash all over body  . Dizziness and weakness   Immunizations Administered    Name Date Dose VIS Date Route   Pfizer COVID-19 Vaccine 03/10/2020  1:57 PM 0.3 mL 11/08/2019 Intramuscular   Manufacturer: [employer], [employer]   Lot: [id number]   NDC: [address]-0

## 2021-05-06 ENCOUNTER — Inpatient Hospital Stay
Admit: 2021-05-06 | Discharge: 2021-05-06 | Disposition: A | Discharge status: Home / Self Care | Payer: TRICARE (CHAMPUS) | Attending: Emergency Medicine

## 2021-05-06 ENCOUNTER — Emergency Department: Admit: 2021-05-06 | Payer: TRICARE (CHAMPUS) | Primary: Student in an Organized Health Care Education/Training Program

## 2021-05-06 DIAGNOSIS — U071 COVID-19: Secondary | ICD-10-CM

## 2021-05-06 LAB — EKG, 12 LEAD, INITIAL
Atrial Rate: 76 {beats}/min
Calculated P Axis: 45 degrees
Calculated R Axis: -27 degrees
Calculated T Axis: -45 degrees
Diagnosis: NORMAL
P-R Interval: 172 ms
Q-T Interval: 394 ms
QRS Duration: 146 ms
QTC Calculation (Bezet): 443 ms
Ventricular Rate: 76 {beats}/min

## 2021-05-06 LAB — CBC WITH AUTOMATED DIFF
ABS. BASOPHILS: 0 10*3/uL (ref 0.0–0.1)
ABS. EOSINOPHILS: 0.1 10*3/uL (ref 0.0–0.4)
ABS. IMM. GRANS.: 0 10*3/uL (ref 0.00–0.04)
ABS. LYMPHOCYTES: 1.9 10*3/uL (ref 0.9–3.6)
ABS. MONOCYTES: 1 10*3/uL (ref 0.05–1.2)
ABS. NEUTROPHILS: 4.4 10*3/uL (ref 1.8–8.0)
ABSOLUTE NRBC: 0 10*3/uL (ref 0.00–0.01)
BASOPHILS: 1 % (ref 0–2)
EOSINOPHILS: 1 % (ref 0–5)
HCT: 37.4 % (ref 36.0–48.0)
HGB: 12.6 g/dL — ABNORMAL LOW (ref 13.0–16.0)
IMMATURE GRANULOCYTES: 0 % (ref 0.0–0.5)
LYMPHOCYTES: 26 % (ref 21–52)
MCH: 29 PG (ref 24.0–34.0)
MCHC: 33.7 g/dL (ref 31.0–37.0)
MCV: 86.2 FL (ref 78.0–100.0)
MONOCYTES: 13 % — ABNORMAL HIGH (ref 3–10)
MPV: 11.6 FL (ref 9.2–11.8)
NEUTROPHILS: 59 % (ref 40–73)
NRBC: 0 PER 100 WBC
PLATELET: 143 10*3/uL (ref 135–420)
RBC: 4.34 M/uL — ABNORMAL LOW (ref 4.35–5.65)
RDW: 14.9 % — ABNORMAL HIGH (ref 11.6–14.5)
WBC: 7.5 10*3/uL (ref 4.6–13.2)

## 2021-05-06 LAB — URINALYSIS W/ RFLX MICROSCOPIC
Bilirubin, Urine: NEGATIVE
Bilirubin: NEGATIVE
Blood, Urine: NEGATIVE
Blood: NEGATIVE
Glucose, Ur: NEGATIVE mg/dL
Glucose: NEGATIVE mg/dL
Ketone: NEGATIVE mg/dL
Ketones, Urine: NEGATIVE mg/dL
Leukocyte Esterase, Urine: NEGATIVE
Leukocyte Esterase: NEGATIVE
Nitrite, Urine: NEGATIVE
Nitrites: NEGATIVE
Protein, UA: NEGATIVE mg/dL
Protein: NEGATIVE mg/dL
Specific Gravity, UA: 1.014 (ref 1.005–1.030)
Specific gravity: 1.014 (ref 1.005–1.030)
Urobilinogen, UA, POCT: 1 EU/dL (ref 0.2–1.0)
Urobilinogen: 1 EU/dL (ref 0.2–1.0)
pH (UA): 6 (ref 5.0–8.0)
pH, UA: 6 (ref 5.0–8.0)

## 2021-05-06 LAB — METABOLIC PANEL, COMPREHENSIVE
A-G Ratio: 0.8 (ref 0.8–1.7)
ALT (SGPT): 59 U/L (ref 16–61)
AST (SGOT): 38 U/L (ref 10–38)
Albumin: 3.4 g/dL (ref 3.4–5.0)
Alk. phosphatase: 69 U/L (ref 45–117)
Anion gap: 8 mmol/L (ref 3.0–18)
BUN/Creatinine ratio: 8 — ABNORMAL LOW (ref 12–20)
BUN: 10 MG/DL (ref 7.0–18)
Bilirubin, total: 0.5 MG/DL (ref 0.2–1.0)
CO2: 27 mmol/L (ref 21–32)
Calcium: 8.6 MG/DL (ref 8.5–10.1)
Chloride: 102 mmol/L (ref 100–111)
Creatinine: 1.23 MG/DL (ref 0.6–1.3)
GFR est AA: >60 mL/min/{1.73_m2} (ref >60)
GFR est non-AA: 60 mL/min/{1.73_m2} — ABNORMAL LOW (ref >60)
Globulin: 4.1 g/dL — ABNORMAL HIGH (ref 2.0–4.0)
Glucose: 155 mg/dL — ABNORMAL HIGH (ref 74–99)
Potassium: 3 mmol/L — ABNORMAL LOW (ref 3.5–5.5)
Protein, total: 7.5 g/dL (ref 6.4–8.2)
Sodium: 137 mmol/L (ref 136–145)

## 2021-05-06 LAB — NT-PRO BNP: NT pro-BNP: 161 PG/ML (ref 0–900)

## 2021-05-06 LAB — EKG, 12 LEAD, SUBSEQUENT
Atrial Rate: 82 {beats}/min
Calculated P Axis: 41 degrees
Calculated R Axis: -25 degrees
Calculated T Axis: -40 degrees
Diagnosis: NORMAL
P-R Interval: 182 ms
Q-T Interval: 386 ms
QRS Duration: 146 ms
QTC Calculation (Bezet): 450 ms
Ventricular Rate: 82 {beats}/min

## 2021-05-06 LAB — SARS-COV-2

## 2021-05-06 LAB — TROPONIN-HIGH SENSITIVITY
Troponin-High Sensitivity: 75 ng/L (ref 0–78)
Troponin-High Sensitivity: 75 ng/L (ref 0–78)

## 2021-05-06 LAB — COMPREHENSIVE METABOLIC PANEL
ALT: 59 U/L (ref 16–61)
AST: 38 U/L (ref 10–38)
Albumin/Globulin Ratio: 0.8 (ref 0.8–1.7)
Albumin: 3.4 g/dL (ref 3.4–5.0)
Alkaline Phosphatase: 69 U/L (ref 45–117)
Anion Gap: 8 mmol/L (ref 3.0–18)
BUN/Creatinine Ratio: 8 — ABNORMAL LOW (ref 12–20)
BUN: 10 MG/DL (ref 7.0–18)
CO2: 27 mmol/L (ref 21–32)
Calcium: 8.6 MG/DL (ref 8.5–10.1)
Chloride: 102 mmol/L (ref 100–111)
Creatinine: 1.23 MG/DL (ref 0.6–1.3)
GFR African American: >60 mL/min/{1.73_m2} (ref >60)
Globulin: 4.1 g/dL — ABNORMAL HIGH (ref 2.0–4.0)
Glucose: 155 mg/dL — ABNORMAL HIGH (ref 74–99)
Potassium: 3 mmol/L — ABNORMAL LOW (ref 3.5–5.5)
Sodium: 137 mmol/L (ref 136–145)
Total Bilirubin: 0.5 MG/DL (ref 0.2–1.0)
Total Protein: 7.5 g/dL (ref 6.4–8.2)
eGFR NON-AA: 60 mL/min/{1.73_m2} — ABNORMAL LOW (ref >60)

## 2021-05-06 LAB — EKG 12-LEAD
Atrial Rate: 76 {beats}/min
Atrial Rate: 82 {beats}/min
Diagnosis: NORMAL
Diagnosis: NORMAL
P Axis: 45 degrees
P Axis: 41 degrees
P-R Interval: 172 ms
P-R Interval: 182 ms
Q-T Interval: 394 ms
Q-T Interval: 386 ms
QRS Duration: 146 ms
QRS Duration: 146 ms
QTc Calculation (Bazett): 443 ms
QTc Calculation (Bazett): 450 ms
R Axis: -27 degrees
R Axis: -25 degrees
T Axis: -45 degrees
T Axis: -40 degrees
Ventricular Rate: 76 {beats}/min
Ventricular Rate: 82 {beats}/min

## 2021-05-06 LAB — CBC WITH AUTO DIFFERENTIAL
Basophils %: 1 % (ref 0–2)
Basophils Absolute: 0 10*3/uL (ref 0.0–0.1)
Eosinophils %: 1 % (ref 0–5)
Eosinophils Absolute: 0.1 10*3/uL (ref 0.0–0.4)
Granulocyte Absolute Count: 0 10*3/uL (ref 0.00–0.04)
Hematocrit: 37.4 % (ref 36.0–48.0)
Hemoglobin: 12.6 g/dL — ABNORMAL LOW (ref 13.0–16.0)
Immature Granulocytes %: 0 % (ref 0.0–0.5)
Lymphocytes %: 26 % (ref 21–52)
Lymphocytes Absolute: 1.9 10*3/uL (ref 0.9–3.6)
MCH: 29 PG (ref 24.0–34.0)
MCHC: 33.7 g/dL (ref 31.0–37.0)
MCV: 86.2 FL (ref 78.0–100.0)
MPV: 11.6 FL (ref 9.2–11.8)
Monocytes %: 13 % — ABNORMAL HIGH (ref 3–10)
Monocytes Absolute: 1 10*3/uL (ref 0.05–1.2)
NRBC Absolute: 0 10*3/uL (ref 0.00–0.01)
Neutrophils %: 59 % (ref 40–73)
Neutrophils Absolute: 4.4 10*3/uL (ref 1.8–8.0)
Nucleated RBCs: 0 PER 100 WBC
Platelets: 143 10*3/uL (ref 135–420)
RBC: 4.34 M/uL — ABNORMAL LOW (ref 4.35–5.65)
RDW: 14.9 % — ABNORMAL HIGH (ref 11.6–14.5)
WBC: 7.5 10*3/uL (ref 4.6–13.2)

## 2021-05-06 LAB — TROPONIN, HIGH SENSITIVITY
Troponin, High Sensitivity: 75 ng/L (ref 0–78)
Troponin, High Sensitivity: 75 ng/L (ref 0–78)

## 2021-05-06 LAB — PROBNP, N-TERMINAL: BNP: 161 PG/ML (ref 0–900)

## 2021-05-06 LAB — COVID-19

## 2021-05-06 MED ORDER — POTASSIUM CHLORIDE SR 20 MEQ TAB, PARTICLES/CRYSTALS
20 mEq | ORAL | Status: AC
Start: 2021-05-06 — End: 2021-05-06
  Administered 2021-05-06 (×2): via ORAL

## 2021-05-06 MED ORDER — SODIUM CHLORIDE 0.9% BOLUS IV
0.9 % | Freq: Once | INTRAVENOUS | Status: AC
Start: 2021-05-06 — End: 2021-05-06
  Administered 2021-05-06: 16:00:00 via INTRAVENOUS

## 2021-05-06 MED ORDER — POTASSIUM CHLORIDE SR 20 MEQ TAB, PARTICLES/CRYSTALS
20 mEq | ORAL | Status: AC
Start: 2021-05-06 — End: 2021-05-06
  Administered 2021-05-06: 17:00:00 via ORAL

## 2021-05-06 MED FILL — KLOR-CON M20 MEQ TABLET,EXTENDED RELEASE: 20 mEq | ORAL | Qty: 2

## 2021-05-06 MED FILL — SODIUM CHLORIDE 0.9 % IV: INTRAVENOUS | Qty: 1000

## 2021-05-06 NOTE — ED Provider Notes (Signed)
EMERGENCY DEPARTMENT HISTORY AND PHYSICAL EXAM    12:20 PM    Date: 05/06/2021  Patient Name: William Wong    History of Presenting Illness     Chief Complaint   Patient presents with   ??? Dizziness       History Provided By: Patient  Location/Duration/Severity/Modifying factors   HPI  William Wong is a 60 y.o. male with asthma history of hypertension diabetes presenting for a near syncopal episode.  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  He was able to close up the trailer, walk around outside to do the same but then sat down because he felt like he was going to fall out.  The triage note says that he is complaining feeling dizzy but on clarification this is more lightheaded and presyncopal.  He is not having any chest pain, shortness of breath, abdominal pain, nausea, vomiting or diaphoresis during this.  He never did lose consciousness, fall or hit his head.  He was hypotensive on EMS arrival, received 200 cc bolus in route and now is normotensive.  He says he is feeling much better now that he is in the cool emergency room.  He denies tobacco, alcohol or illicit drug use.  No alleviating or aggravating factors.  Additionally complains of a dry nonproductive cough for the last few days, has been vaccinated against COVID and received a booster shot.  Would like to be tested for COVID.    PCP: Alma Downs, Not On File, MD    Current Facility-Administered Medications   Medication Dose Route Frequency Provider Last Rate Last Admin   ??? potassium chloride (K-DUR, KLOR-CON M20) SR tablet 40 mEq  40 mEq Oral NOW Abner Greenspan, MD         Current Outpatient Medications   Medication Sig Dispense Refill   ??? omeprazole (PRILOSEC) 20 mg capsule Take 20 mg by mouth daily.     ??? metFORMIN (GLUCOPHAGE) 500 mg tablet Take  by mouth two (2) times daily (with meals).     ??? Olmesartan-amLODIPine-HCTZ 40-10-12.5 mg tab Take 1 Tab  by mouth daily.     ??? cholecalciferol, vitamin D3, 2,000 unit tab Take 2.5 Tabs by mouth daily.     ??? atorvastatin (LIPITOR) 20 mg tablet Take 20 mg by mouth daily.     ??? nebivolol (BYSTOLIC) 20 mg tablet Take 20 mg by mouth daily.     ??? aspirin delayed-release 81 mg tablet Take 81 mg by mouth daily.     ??? multivitamin (ONE A DAY) tablet Take 1 Tab by mouth daily.     ??? ondansetron hcl (ZOFRAN, AS HYDROCHLORIDE,) 4 mg tablet Take 1 Tab by mouth every eight (8) hours as needed for Nausea. 10 Tab 0       Past History     Past Medical History:  Past Medical History:   Diagnosis Date   ??? Diabetes ([place])    ??? Gastrointestinal disorder     GERD   ??? Hypertension        Past Surgical History:  Past Surgical History:   Procedure Laterality Date   ??? HX ORTHOPAEDIC      , back   ??? HX OTHER SURGICAL      tonsillectomy       Family History:  Family History   Problem Relation Age of Onset   ???  Heart Disease Mother    ??? Stroke Father        Social History:  Social History     Tobacco Use   ??? Smoking status: Never Smoker   ??? Smokeless tobacco: Never Used   Substance Use Topics   ??? Alcohol use: Yes     Comment: socially   ??? Drug use: Not on file       Allergies:  No Known Allergies    I reviewed and confirmed the above information with patient and updated as necessary.    Review of Systems     Review of Systems   Constitutional: Negative for diaphoresis and fever.   HENT: Negative for ear pain and sore throat.    Eyes:        No acute change in vision   Respiratory: Negative for cough and shortness of breath.    Cardiovascular: Negative for chest pain and leg swelling.   Gastrointestinal: Negative for abdominal pain and vomiting.   Genitourinary: Negative for dysuria.   Musculoskeletal: Negative for neck pain.   Skin: Negative for wound.   Neurological: Positive for syncope (Near syncope). Negative for weakness and headaches.       Physical Exam     Visit Vitals  BP (!) 149/78   Pulse 79   Temp 98.6 ??F (37 ??C)   Resp 20   Ht '5\' 9"'$   (1.753 m)   Wt 122.5 kg (270 lb)   SpO2 98%   BMI 39.87 kg/m??       Physical Exam  Vitals and nursing note reviewed.   Constitutional:       Appearance: Normal appearance. He is not ill-appearing.      Comments: Adult male resting comfortably, awake and alert, on the phone   HENT:      Mouth/Throat:      Mouth: Mucous membranes are moist.   Eyes:      Pupils: Pupils are equal, round, and reactive to light.   Cardiovascular:      Rate and Rhythm: Normal rate and regular rhythm.      Pulses: Normal pulses.      Heart sounds: Normal heart sounds.   Pulmonary:      Effort: Pulmonary effort is normal.      Breath sounds: Normal breath sounds.   Abdominal:      General: Abdomen is flat.      Tenderness: There is no abdominal tenderness.   Musculoskeletal:         General: No swelling. Normal range of motion.      Cervical back: Normal range of motion.   Skin:     General: Skin is warm.   Neurological:      General: No focal deficit present.      Mental Status: He is alert and oriented to person, place, and time.      Cranial Nerves: No cranial nerve deficit.      Sensory: No sensory deficit.      Motor: No weakness.         Diagnostic Study Results     Labs -  Recent Results (from the past 24 hour(s))   CBC WITH AUTOMATED DIFF    Collection Time: 05/06/21 12:00 PM   Result Value Ref Range    WBC 7.5 4.6 - 13.2 K/uL    RBC 4.34 (L) 4.35 - 5.65 M/uL    HGB 12.6 (L) 13.0 - 16.0 g/dL    HCT 37.4 36.0 -  48.0 %    MCV 86.2 78.0 - 100.0 FL    MCH 29.0 24.0 - 34.0 PG    MCHC 33.7 31.0 - 37.0 g/dL    RDW 14.9 (H) 11.6 - 14.5 %    PLATELET 143 135 - 420 K/uL    MPV 11.6 9.2 - 11.8 FL    NRBC 0.0 0 PER 100 WBC    ABSOLUTE NRBC 0.00 0.00 - 0.01 K/uL    NEUTROPHILS 59 40 - 73 %    LYMPHOCYTES 26 21 - 52 %    MONOCYTES 13 (H) 3 - 10 %    EOSINOPHILS 1 0 - 5 %    BASOPHILS 1 0 - 2 %    IMMATURE GRANULOCYTES 0 0.0 - 0.5 %    ABS. NEUTROPHILS 4.4 1.8 - 8.0 K/UL    ABS. LYMPHOCYTES 1.9 0.9 - 3.6 K/UL    ABS. MONOCYTES 1.0 0.05 - 1.2 K/UL     ABS. EOSINOPHILS 0.1 0.0 - 0.4 K/UL    ABS. BASOPHILS 0.0 0.0 - 0.1 K/UL    ABS. IMM. GRANS. 0.0 0.00 - 0.04 K/UL    DF AUTOMATED     METABOLIC PANEL, COMPREHENSIVE    Collection Time: 05/06/21 12:00 PM   Result Value Ref Range    Sodium 137 136 - 145 mmol/L    Potassium 3.0 (L) 3.5 - 5.5 mmol/L    Chloride 102 100 - 111 mmol/L    CO2 27 21 - 32 mmol/L    Anion gap 8 3.0 - 18 mmol/L    Glucose 155 (H) 74 - 99 mg/dL    BUN 10 7.0 - 18 MG/DL    Creatinine 1.23 0.6 - 1.3 MG/DL    BUN/Creatinine ratio 8 (L) 12 - 20      GFR est AA >60 >60 ml/min/1.71m    GFR est non-AA 60 (L) >60 ml/min/1.75m   Calcium 8.6 8.5 - 10.1 MG/DL    Bilirubin, total 0.5 0.2 - 1.0 MG/DL    ALT (SGPT) 59 16 - 61 U/L    AST (SGOT) 38 10 - 38 U/L    Alk. phosphatase 69 45 - 117 U/L    Protein, total 7.5 6.4 - 8.2 g/dL    Albumin 3.4 3.4 - 5.0 g/dL    Globulin 4.1 (H) 2.0 - 4.0 g/dL    A-G Ratio 0.8 0.8 - 1.7     TROPONIN-HIGH SENSITIVITY    Collection Time: 05/06/21 12:00 PM   Result Value Ref Range    Troponin-High Sensitivity 75 0 - 78 ng/L   NT-PRO BNP    Collection Time: 05/06/21 12:00 PM   Result Value Ref Range    NT pro-BNP 161 0 - 900 PG/ML   EKG, 12 LEAD, INITIAL    Collection Time: 05/06/21 12:07 PM   Result Value Ref Range    Ventricular Rate 76 BPM    Atrial Rate 76 BPM    P-R Interval 172 ms    QRS Duration 146 ms    Q-T Interval 394 ms    QTC Calculation (Bezet) 443 ms    Calculated P Axis 45 degrees    Calculated R Axis -27 degrees    Calculated T Axis -45 degrees    Diagnosis       Normal sinus rhythm  Right bundle branch block  Minimal voltage criteria for LVH, may be normal variant  T wave abnormality, consider inferolateral ischemia  Abnormal ECG  No previous ECGs  available  Confirmed by Larry Sierras, MD, [place] [phone]) on 05/06/2021 12:41:35 PM     SARS-COV-2    Collection Time: 05/06/21 12:30 PM   Result Value Ref Range    SARS-CoV-2 by PCR Please find results under separate order     EKG, 12 LEAD, SUBSEQUENT    Collection Time:  05/06/21  2:29 PM   Result Value Ref Range    Ventricular Rate 82 BPM    Atrial Rate 82 BPM    P-R Interval 182 ms    QRS Duration 146 ms    Q-T Interval 386 ms    QTC Calculation (Bezet) 450 ms    Calculated P Axis 41 degrees    Calculated R Axis -25 degrees    Calculated T Axis -40 degrees    Diagnosis       Normal sinus rhythm  Right bundle branch block  T wave abnormality, consider lateral ischemia  Abnormal ECG  When compared with ECG of 06-May-2021 12:07,  No significant change was found  Confirmed by Larry Sierras, MD, [place] ([id number]) on 05/06/2021 4:07:54 PM     URINALYSIS W/ RFLX MICROSCOPIC    Collection Time: 05/06/21  2:30 PM   Result Value Ref Range    Color YELLOW      Appearance CLEAR      Specific gravity 1.014 1.005 - 1.030      pH (UA) 6.0 5.0 - 8.0      Protein Negative NEG mg/dL    Glucose Negative NEG mg/dL    Ketone Negative NEG mg/dL    Bilirubin Negative NEG      Blood Negative NEG      Urobilinogen 1.0 0.2 - 1.0 EU/dL    Nitrites Negative NEG      Leukocyte Esterase Negative NEG     TROPONIN-HIGH SENSITIVITY    Collection Time: 05/06/21  2:30 PM   Result Value Ref Range    Troponin-High Sensitivity 75 0 - 78 ng/L         Radiologic Studies -   XR CHEST PORT   Final Result   No acute cardiopulmonary findings.              Medical Decision Making   I am the first provider for this patient.    I reviewed the vital signs, available nursing notes, past medical history, past surgical history, family history and social history.    Vital Signs-Reviewed the patient's vital signs.    EKG: Right bundle branch block, no ST elevation, no priors for comparison    Records Reviewed: Nursing Notes and Old Medical Records (Time of Review: 12:20 PM)    Provider Notes (Medical Decision Making):   MDM  60 year old male here for any syncopal episode consider dehydration, AKI, arrhythmia, no evidence of any seizure or trauma    ED Course: Progress Notes, Reevaluation, and Consults:  Patient afebrile and hemodynamically normal  on arrival, awake and alert, oriented, exam is reassuring    We will screen labs, troponin, chest x-ray.  At his request will test for COVID.    CBC unremarkable  CMP with hypokalemia, will replete orally  Initial troponin 75, EKG shows no ST elevation but there is no prior for comparison, he does have a right bundle branch block with ST depression diffusely, but not focally  Repeat EKG and troponin are level at 75  I discussed with Renee from cardiology who will talk to her attending, and independently reviewed the EKG, and recommend outpatient follow-up,  I think this is reasonable as the patient is asymptomatic and has had no chest pain    Patient was discharged home in stable condition, will follow up with cardiology outpatient.  Signs and symptoms prompting return to the ED were discussed.  He was discharged home in stable condition.           Procedures    Diagnosis     Clinical Impression:   1. Near syncope    2. Right bundle branch block (RBBB)    3. EKG abnormality    4. Hypokalemia        Disposition:Home    Follow-up Information     Follow up With Specialties Details [place]    [facility] EMERGENCY DEPT Emergency Medicine  As needed, If symptoms worsen [place]    Geoffery Lyons, MD Cardiovascular Disease Physician, Internal Medicine Physician Schedule an appointment as soon as possible for a visit   [address]  [address]  [place] [address]  [phone]             Patient's Medications   Start Taking    No medications on file   Continue Taking    ASPIRIN DELAYED-RELEASE 81 MG TABLET    Take 81 mg by mouth daily.    ATORVASTATIN (LIPITOR) 20 MG TABLET    Take 20 mg by mouth daily.    CHOLECALCIFEROL, VITAMIN D3, 2,000 UNIT TAB    Take 2.5 Tabs by mouth daily.    METFORMIN (GLUCOPHAGE) 500 MG TABLET    Take  by mouth two (2) times daily (with meals).    MULTIVITAMIN (ONE A DAY) TABLET    Take 1 Tab by mouth daily.    NEBIVOLOL (BYSTOLIC) 20 MG  TABLET    Take 20 mg by mouth daily.    OLMESARTAN-AMLODIPINE-HCTZ 40-10-12.5 MG TAB    Take 1 Tab by mouth daily.    OMEPRAZOLE (PRILOSEC) 20 MG CAPSULE    Take 20 mg by mouth daily.    ONDANSETRON HCL (ZOFRAN, AS HYDROCHLORIDE,) 4 MG TABLET    Take 1 Tab by mouth every eight (8) hours as needed for Nausea.   These Medications have changed    No medications on file   Stop Taking    No medications on file       Darlin Coco, MD   Emergency Medicine   May 06, 2021, 12:20 PM     This note is dictated utilizing Systems analyst. Unfortunately this leads to occasional typographical errors using the voice recognition. I apologize in advance if the situation occurs. If questions occur please do not hesitate to contact me directly.    Abner Greenspan, MD

## 2021-05-06 NOTE — ED Notes (Unsigned)
Pt discharged    Pt provided with discharge instructions and explained symptoms to return to the ed.

## 2021-05-06 NOTE — ED Notes (Signed)
Rounded on pt    Pt resting in position of comfort    Family at bedside     Repeat labs collected.

## 2021-05-06 NOTE — ED Notes (Signed)
Pt arrives via ems with c/o syncopal episode    Pt was at work when this occurred    He became dizzy    Did not fall or hit head    Well-appearing    Was hypotensive with ems

## 2021-05-06 NOTE — ED Notes (Signed)
Pt is currently on his cell phone notifying his family that he is in the emergency room.    Currently stable

## 2021-05-06 NOTE — ED Notes (Signed)
Rounded on pt    Pt is unable to urinate at this time.

## 2021-05-07 LAB — SARS-COV-2 BY NAA: SARS-CoV-2, NAA: DETECTED — CR

## 2024-09-18 ENCOUNTER — Other Ambulatory Visit: Payer: Self-pay | Admitting: Nurse Practitioner

## 2024-09-18 DIAGNOSIS — R972 Elevated prostate specific antigen [PSA]: Secondary | ICD-10-CM

## 2024-10-26 ENCOUNTER — Ambulatory Visit
Admission: RE | Admit: 2024-10-26 | Discharge: 2024-10-26 | Disposition: A | Discharge status: Home / Self Care | Source: Ambulatory Visit | Attending: Nurse Practitioner | Admitting: Nurse Practitioner

## 2024-10-26 DIAGNOSIS — R972 Elevated prostate specific antigen [PSA]: Secondary | ICD-10-CM

## 2024-10-26 MED ORDER — GADOPICLENOL 0.5 MMOL/ML IV SOLN
10.0000 mL | Freq: Once | INTRAVENOUS | Status: AC | PRN
  Administered 2024-10-26: 10 mL via INTRAVENOUS
